# Patient Record
Sex: Female | Born: 1995 | Race: Black or African American | Hispanic: No | Marital: Single | Smoking: Current every day smoker
Health system: Southern US, Community
[De-identification: ages and names within clinical notes are randomized; demographics above are authoritative.]

## PROBLEM LIST (undated history)

## (undated) DIAGNOSIS — E0500 Thyrotoxicosis with diffuse goiter without thyrotoxic crisis or storm: Secondary | ICD-10-CM

---
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
## 2013-09-11 ENCOUNTER — Encounter (HOSPITAL_COMMUNITY): Payer: Self-pay | Admitting: Emergency Medicine

## 2013-09-11 ENCOUNTER — Emergency Department (HOSPITAL_COMMUNITY)
Admission: EM | Admit: 2013-09-11 | Discharge: 2013-09-12 | Disposition: A | Discharge status: Home / Self Care | Payer: 59 | Attending: Emergency Medicine | Admitting: Emergency Medicine

## 2013-09-11 DIAGNOSIS — IMO0001 Reserved for inherently not codable concepts without codable children: Secondary | ICD-10-CM | POA: Diagnosis not present

## 2013-09-11 DIAGNOSIS — R110 Nausea: Secondary | ICD-10-CM | POA: Diagnosis not present

## 2013-09-11 DIAGNOSIS — R6510 Systemic inflammatory response syndrome (SIRS) of non-infectious origin without acute organ dysfunction: Secondary | ICD-10-CM | POA: Diagnosis not present

## 2013-09-11 DIAGNOSIS — A419 Sepsis, unspecified organism: Secondary | ICD-10-CM | POA: Diagnosis not present

## 2013-09-11 DIAGNOSIS — Z862 Personal history of diseases of the blood and blood-forming organs and certain disorders involving the immune mechanism: Secondary | ICD-10-CM | POA: Insufficient documentation

## 2013-09-11 DIAGNOSIS — R197 Diarrhea, unspecified: Secondary | ICD-10-CM | POA: Insufficient documentation

## 2013-09-11 DIAGNOSIS — J029 Acute pharyngitis, unspecified: Secondary | ICD-10-CM

## 2013-09-11 DIAGNOSIS — Z8639 Personal history of other endocrine, nutritional and metabolic disease: Secondary | ICD-10-CM | POA: Insufficient documentation

## 2013-09-11 DIAGNOSIS — Z3202 Encounter for pregnancy test, result negative: Secondary | ICD-10-CM | POA: Diagnosis not present

## 2013-09-11 DIAGNOSIS — J020 Streptococcal pharyngitis: Secondary | ICD-10-CM

## 2013-09-11 LAB — CBC WITH DIFFERENTIAL/PLATELET
Basophils Absolute: 0 10*3/uL (ref 0.0–0.1)
Basophils Relative: 0 % (ref 0–1)
Eosinophils Absolute: 0.1 10*3/uL (ref 0.0–0.7)
Eosinophils Relative: 0 % (ref 0–5)
HCT: 39.5 % (ref 36.0–46.0)
HEMOGLOBIN: 12.7 g/dL (ref 12.0–15.0)
LYMPHS ABS: 1.3 10*3/uL (ref 0.7–4.0)
Lymphocytes Relative: 12 % (ref 12–46)
MCH: 24.4 pg — ABNORMAL LOW (ref 26.0–34.0)
MCHC: 32.2 g/dL (ref 30.0–36.0)
MCV: 76 fL — ABNORMAL LOW (ref 78.0–100.0)
MONOS PCT: 9 % (ref 3–12)
Monocytes Absolute: 1 10*3/uL (ref 0.1–1.0)
NEUTROS ABS: 9 10*3/uL — AB (ref 1.7–7.7)
NEUTROS PCT: 79 % — AB (ref 43–77)
Platelets: 240 10*3/uL (ref 150–400)
RBC: 5.2 MIL/uL — AB (ref 3.87–5.11)
RDW: 15.1 % (ref 11.5–15.5)
WBC: 11.4 10*3/uL — AB (ref 4.0–10.5)

## 2013-09-11 LAB — COMPREHENSIVE METABOLIC PANEL
ALBUMIN: 3.8 g/dL (ref 3.5–5.2)
ALK PHOS: 70 U/L (ref 39–117)
ALT: 13 U/L (ref 0–35)
AST: 15 U/L (ref 0–37)
BILIRUBIN TOTAL: 0.4 mg/dL (ref 0.3–1.2)
BUN: 10 mg/dL (ref 6–23)
CHLORIDE: 100 meq/L (ref 96–112)
CO2: 23 mEq/L (ref 19–32)
Calcium: 9.3 mg/dL (ref 8.4–10.5)
Creatinine, Ser: 0.78 mg/dL (ref 0.50–1.10)
GFR calc non Af Amer: >90 mL/min (ref >90)
GLUCOSE: 93 mg/dL (ref 70–99)
POTASSIUM: 3.9 meq/L (ref 3.7–5.3)
SODIUM: 138 meq/L (ref 137–147)
Total Protein: 8.6 g/dL — ABNORMAL HIGH (ref 6.0–8.3)

## 2013-09-11 LAB — POCT PREGNANCY, URINE: PREG TEST UR: NEGATIVE

## 2013-09-11 NOTE — ED Notes (Signed)
Pt states sore throat, vomiting and diarrhea for two days

## 2013-09-12 LAB — RAPID STREP SCREEN (MED CTR MEBANE ONLY): Streptococcus, Group A Screen (Direct): POSITIVE — AB

## 2013-09-12 LAB — MONONUCLEOSIS SCREEN: Mono Screen: NEGATIVE

## 2013-09-12 MED ORDER — METHYLPREDNISOLONE SODIUM SUCC 125 MG IJ SOLR
125.0000 mg | Freq: Once | INTRAMUSCULAR | Status: AC
  Administered 2013-09-12: 125 mg via INTRAVENOUS
  Filled 2013-09-12: qty 2

## 2013-09-12 MED ORDER — AMOXICILLIN 500 MG PO CAPS: 1000.0000 mg | ORAL_CAPSULE | Freq: Two times a day (BID) | ORAL | Status: DC

## 2013-09-12 MED ORDER — SODIUM CHLORIDE 0.9 % IV BOLUS (SEPSIS)
1000.0000 mL | Freq: Once | INTRAVENOUS | Status: AC
  Administered 2013-09-12: 1000 mL via INTRAVENOUS

## 2013-09-12 MED ORDER — ONDANSETRON HCL 4 MG/2ML IJ SOLN
4.0000 mg | Freq: Once | INTRAMUSCULAR | Status: AC
  Administered 2013-09-12: 4 mg via INTRAVENOUS
  Filled 2013-09-12: qty 2

## 2013-09-12 MED ORDER — IBUPROFEN 600 MG PO TABS: 600.0000 mg | ORAL_TABLET | Freq: Four times a day (QID) | ORAL | Status: DC | PRN

## 2013-09-12 MED ORDER — TRAMADOL HCL 50 MG PO TABS: 50.0000 mg | ORAL_TABLET | Freq: Four times a day (QID) | ORAL | Status: DC | PRN

## 2013-09-12 MED ORDER — KETOROLAC TROMETHAMINE 30 MG/ML IJ SOLN
30.0000 mg | Freq: Once | INTRAMUSCULAR | Status: AC
  Administered 2013-09-12: 30 mg via INTRAVENOUS
  Filled 2013-09-12: qty 1

## 2013-09-12 MED ORDER — ACETAMINOPHEN 500 MG PO TABS
1000.0000 mg | ORAL_TABLET | Freq: Once | ORAL | Status: DC
  Filled 2013-09-12: qty 2

## 2013-09-12 MED ORDER — MORPHINE SULFATE 4 MG/ML IJ SOLN
4.0000 mg | Freq: Once | INTRAMUSCULAR | Status: AC
  Administered 2013-09-12: 4 mg via INTRAVENOUS
  Filled 2013-09-12: qty 1

## 2013-09-12 MED ORDER — DEXTROSE 5 % IV SOLN
1.0000 g | Freq: Once | INTRAVENOUS | Status: AC
  Administered 2013-09-12: 1 g via INTRAVENOUS
  Filled 2013-09-12: qty 10

## 2013-09-12 NOTE — ED Provider Notes (Signed)
CSN: [id number]     Arrival date & time 09/11/13  2114 History   First MD Initiated Contact with Patient 09/12/13 0029     Chief Complaint  Patient presents with  . Nausea  . Sore Throat  . Diarrhea   (Consider location/radiation/quality/duration/timing/severity/associated sxs/prior Treatment) HPI This patient is a 18 year old woman who presents with complaints of sore throat for the past 2 days. Pain is bilateral. Worse with swallowing. It over 10 in severity. Patient is noted have a low-grade fever of 100.7 in the emergency department. She was unaware of fever at home.  She is myalgias, headache, nausea and has had a couple episodes of diarrhea. She denies genitourinary symptoms. Abdominal pain. The patient says that she has experienced both strep pharyngitis and mononucleosis associated pharyngitis in the past.  Past Medical History  Diagnosis Date  . Graves' disease    History reviewed. No pertinent past surgical history. Family History  Problem Relation Age of Onset  . Hypertension Other   . Diabetes Other    History  Substance Use Topics  . Smoking status: Never Smoker   . Smokeless tobacco: Not on file  . Alcohol Use: No   OB History   Grav Para Term Preterm Abortions TAB SAB Ect Mult Living                 Review of Systems Ten point review of symptoms performed and is negative with the exception of symptoms noted above.   Allergies  Review of patient's allergies indicates no known allergies.  Home Medications   Current Outpatient Rx  Name  Route  Sig  Dispense  Refill  . etonogestrel (IMPLANON) 68 MG IMPL implant   Subcutaneous   Inject 1 each into the skin once.          BP 126/73  Pulse 116  Temp(Src) 100.7 F (38.2 C) (Oral)  Resp 18  SpO2 100% Physical Exam Gen: well developed and well nourished appearing Head: NCAT Eyes: PERL, EOMI Nose: no epistaixis or rhinorrhea Mouth/throat: mucosa is moist and pink,  tonsils appear mild to moderately  enlarged, inflamed and with patchy white exudate. No peritonsillar abscess is observed, the uvula is midline. Voice is normal. Neck: supple, no stridor Lungs: CTA B, no wheezing, rhonchi or rales CV: Rapid and regular, pulse 116, no murmur, extremities appear well perfused.  Abd: soft, notender, nondistended Back: no ttp, no cva ttp Skin: warm and dry Ext: normal to inspection, no dependent edema Neuro: CN ii-xii grossly intact, no focal deficits Psyche; normal affect,  calm and cooperative.   ED Course  Procedures (including critical care time) Labs Review  Results for orders placed during the hospital encounter of 09/11/13 (from the past 24 hour(s))  CBC WITH DIFFERENTIAL     Status: Abnormal   Collection Time    09/11/13  9:22 PM      Result Value Range   WBC 11.4 (*) 4.0 - 10.5 K/uL   RBC 5.20 (*) 3.87 - 5.11 MIL/uL   Hemoglobin 12.7  12.0 - 15.0 g/dL   HCT 40.939.5  81.136.0 - 91.446.0 %   MCV 76.0 (*) 78.0 - 100.0 fL   MCH 24.4 (*) 26.0 - 34.0 pg   MCHC 32.2  30.0 - 36.0 g/dL   RDW 78.215.1  95.611.5 - 21.315.5 %   Platelets 240  150 - 400 K/uL   Neutrophils Relative % 79 (*) 43 - 77 %   Neutro Abs 9.0 (*) 1.7 - 7.7  K/uL   Lymphocytes Relative 12  12 - 46 %   Lymphs Abs 1.3  0.7 - 4.0 K/uL   Monocytes Relative 9  3 - 12 %   Monocytes Absolute 1.0  0.1 - 1.0 K/uL   Eosinophils Relative 0  0 - 5 %   Eosinophils Absolute 0.1  0.0 - 0.7 K/uL   Basophils Relative 0  0 - 1 %   Basophils Absolute 0.0  0.0 - 0.1 K/uL  COMPREHENSIVE METABOLIC PANEL     Status: Abnormal   Collection Time    09/11/13  9:22 PM      Result Value Range   Sodium 138  137 - 147 mEq/L   Potassium 3.9  3.7 - 5.3 mEq/L   Chloride 100  96 - 112 mEq/L   CO2 23  19 - 32 mEq/L   Glucose, Bld 93  70 - 99 mg/dL   BUN 10  6 - 23 mg/dL   Creatinine, Ser 4.09  0.50 - 1.10 mg/dL   Calcium 9.3  8.4 - 81.1 mg/dL   Total Protein 8.6 (*) 6.0 - 8.3 g/dL   Albumin 3.8  3.5 - 5.2 g/dL   AST 15  0 - 37 U/L   ALT 13  0 - 35 U/L    Alkaline Phosphatase 70  39 - 117 U/L   Total Bilirubin 0.4  0.3 - 1.2 mg/dL   GFR calc non Af Amer >90  >90 mL/min   GFR calc Af Amer >90  >90 mL/min  POCT PREGNANCY, URINE     Status: None   Collection Time    09/12/13 12:00 AM      Result Value Range   Preg Test, Ur NEGATIVE  NEGATIVE     MDM  Patient with acute pharyngitis and two over four SIRS criteria. We will manage his IV fluids, a single dose of IV Solu-Medrol. Therapy with antibiotics-first dose given intravenously. Strep screens are pending. Anticipate discharge home with plan for close outpatient followup and return precautions the    Brandt Loosen, MD 09/12/13 [phone]

## 2013-09-23 ENCOUNTER — Encounter (HOSPITAL_COMMUNITY): Payer: Self-pay | Admitting: Emergency Medicine

## 2013-09-23 ENCOUNTER — Emergency Department (HOSPITAL_COMMUNITY)
Admission: EM | Admit: 2013-09-23 | Discharge: 2013-09-23 | Disposition: A | Discharge status: Home / Self Care | Payer: BC Managed Care – PPO | Attending: Emergency Medicine | Admitting: Emergency Medicine

## 2013-09-23 DIAGNOSIS — R112 Nausea with vomiting, unspecified: Secondary | ICD-10-CM

## 2013-09-23 DIAGNOSIS — E0500 Thyrotoxicosis with diffuse goiter without thyrotoxic crisis or storm: Secondary | ICD-10-CM | POA: Insufficient documentation

## 2013-09-23 DIAGNOSIS — Z79899 Other long term (current) drug therapy: Secondary | ICD-10-CM | POA: Insufficient documentation

## 2013-09-23 LAB — URINALYSIS, ROUTINE W REFLEX MICROSCOPIC
Glucose, UA: NEGATIVE mg/dL
Hgb urine dipstick: NEGATIVE
Ketones, ur: 15 mg/dL — AB
NITRITE: NEGATIVE
PH: 6 (ref 5.0–8.0)
Protein, ur: 30 mg/dL — AB
SPECIFIC GRAVITY, URINE: 1.029 (ref 1.005–1.030)
UROBILINOGEN UA: 0.2 mg/dL (ref 0.0–1.0)

## 2013-09-23 LAB — CBC WITH DIFFERENTIAL/PLATELET
Basophils Absolute: 0 10*3/uL (ref 0.0–0.1)
Basophils Relative: 0 % (ref 0–1)
EOS ABS: 0.1 10*3/uL (ref 0.0–0.7)
EOS PCT: 1 % (ref 0–5)
HEMATOCRIT: 40.2 % (ref 36.0–46.0)
Hemoglobin: 13.1 g/dL (ref 12.0–15.0)
LYMPHS ABS: 1.5 10*3/uL (ref 0.7–4.0)
LYMPHS PCT: 13 % (ref 12–46)
MCH: 24.6 pg — AB (ref 26.0–34.0)
MCHC: 32.6 g/dL (ref 30.0–36.0)
MCV: 75.6 fL — AB (ref 78.0–100.0)
MONO ABS: 0.6 10*3/uL (ref 0.1–1.0)
Monocytes Relative: 5 % (ref 3–12)
Neutro Abs: 9.3 10*3/uL — ABNORMAL HIGH (ref 1.7–7.7)
Neutrophils Relative %: 81 % — ABNORMAL HIGH (ref 43–77)
PLATELETS: 229 10*3/uL (ref 150–400)
RBC: 5.32 MIL/uL — AB (ref 3.87–5.11)
RDW: 15.4 % (ref 11.5–15.5)
WBC: 11.5 10*3/uL — ABNORMAL HIGH (ref 4.0–10.5)

## 2013-09-23 LAB — COMPREHENSIVE METABOLIC PANEL
ALT: 23 U/L (ref 0–35)
AST: 18 U/L (ref 0–37)
Albumin: 3.8 g/dL (ref 3.5–5.2)
Alkaline Phosphatase: 70 U/L (ref 39–117)
BUN: 12 mg/dL (ref 6–23)
CALCIUM: 9.2 mg/dL (ref 8.4–10.5)
CO2: 22 meq/L (ref 19–32)
CREATININE: 0.73 mg/dL (ref 0.50–1.10)
Chloride: 101 mEq/L (ref 96–112)
GLUCOSE: 119 mg/dL — AB (ref 70–99)
Potassium: 3.9 mEq/L (ref 3.7–5.3)
SODIUM: 140 meq/L (ref 137–147)
TOTAL PROTEIN: 8.2 g/dL (ref 6.0–8.3)
Total Bilirubin: 0.2 mg/dL — ABNORMAL LOW (ref 0.3–1.2)

## 2013-09-23 LAB — URINE MICROSCOPIC-ADD ON

## 2013-09-23 LAB — POCT PREGNANCY, URINE: Preg Test, Ur: NEGATIVE

## 2013-09-23 MED ORDER — ONDANSETRON 4 MG PO TBDP: 8.0000 mg | ORAL_TABLET | ORAL | Status: DC

## 2013-09-23 MED ORDER — ONDANSETRON HCL 4 MG PO TABS: 4.0000 mg | ORAL_TABLET | Freq: Four times a day (QID) | ORAL | Status: DC

## 2013-09-23 MED ORDER — ONDANSETRON 4 MG PO TBDP
8.0000 mg | ORAL_TABLET | Freq: Once | ORAL | Status: AC
  Administered 2013-09-23: 8 mg via ORAL
  Filled 2013-09-23: qty 2

## 2013-09-23 NOTE — ED Provider Notes (Signed)
CSN: [id number]     Arrival date & time 09/23/13  0218 History   First MD Initiated Contact with Patient 09/23/13 0421     Chief Complaint  Patient presents with  . Emesis   (Consider location/radiation/quality/duration/timing/severity/associated sxs/prior Treatment) HPI Pt presenting with c/o nausea and vomiting.  Pt states symptoms began this evening after waking up from a nap.  She states she has had approx 7 episdoes of emesis.  Nonbloody and nonbilious.  No abdominal pain.  No dysuria.  Pt states she has felt cold chills.  Denies increased urinary frequency, or urgency.  No known sick contacts.  No cough or dificulty breathing.  Triage note states lower abdominal cramping, but patient denied having any abdominal pain to me when asked.  She has not been able to keep down liquids since vomiting began.  There are no other associated systemic symptoms, there are no other alleviating or modifying factors.   Past Medical History  Diagnosis Date  . Graves' disease    History reviewed. No pertinent past surgical history. Family History  Problem Relation Age of Onset  . Hypertension Other   . Diabetes Other    History  Substance Use Topics  . Smoking status: Never Smoker   . Smokeless tobacco: Not on file  . Alcohol Use: No   OB History   Grav Para Term Preterm Abortions TAB SAB Ect Mult Living                 Review of Systems ROS reviewed and all otherwise negative except for mentioned in HPI  Allergies  Review of patient's allergies indicates no known allergies.  Home Medications   Current Outpatient Rx  Name  Route  Sig  Dispense  Refill  . etonogestrel (IMPLANON) 68 MG IMPL implant   Subcutaneous   Inject 1 each into the skin once.         Marland Kitchen. amoxicillin (AMOXIL) 500 MG capsule   Oral   Take 2 capsules (1,000 mg total) by mouth 2 (two) times daily.   20 capsule   0   . ibuprofen (ADVIL,MOTRIN) 600 MG tablet   Oral   Take 1 tablet (600 mg total) by mouth every 6  (six) hours as needed.   30 tablet   0   . ondansetron (ZOFRAN) 4 MG tablet   Oral   Take 1 tablet (4 mg total) by mouth every 6 (six) hours.   12 tablet   0   . traMADol (ULTRAM) 50 MG tablet   Oral   Take 1 tablet (50 mg total) by mouth every 6 (six) hours as needed.   15 tablet   0    BP 142/63  Pulse 104  Temp(Src) 98.6 F (37 C) (Oral)  Resp 16  Ht 5\' 4"  (1.626 m)  Wt 235 lb (106.595 kg)  BMI 40.32 kg/m2  SpO2 100% Vitals reviewed Physical Exam Physical Examination: General appearance - alert, well appearing, and in no distress Mental status - alert, oriented to person, place, and time Eyes - no scleral icterus, no conjunctival injection Mouth - mucous membranes moist, pharynx normal without lesions Chest - clear to auscultation, no wheezes, rales or rhonchi, symmetric air entry Heart - normal rate, regular rhythm, normal S1, S2, no murmurs, rubs, clicks or gallops Abdomen - soft, nontender, nondistended, no masses or organomegaly, nabs Extremities - peripheral pulses normal, no pedal edema, no clubbing or cyanosis Skin - normal coloration and turgor, no rashes  ED Course  Procedures (including critical care time) Labs Review Labs Reviewed  CBC WITH DIFFERENTIAL - Abnormal; Notable for the following:    WBC 11.5 (*)    RBC 5.32 (*)    MCV 75.6 (*)    MCH 24.6 (*)    Neutrophils Relative % 81 (*)    Neutro Abs 9.3 (*)    All other components within normal limits  COMPREHENSIVE METABOLIC PANEL - Abnormal; Notable for the following:    Glucose, Bld 119 (*)    Total Bilirubin 0.2 (*)    All other components within normal limits  URINALYSIS, ROUTINE W REFLEX MICROSCOPIC - Abnormal; Notable for the following:    APPearance CLOUDY (*)    Bilirubin Urine SMALL (*)    Ketones, ur 15 (*)    Protein, ur 30 (*)    Leukocytes, UA SMALL (*)    All other components within normal limits  URINE MICROSCOPIC-ADD ON - Abnormal; Notable for the following:    Squamous  Epithelial / LPF MANY (*)    Bacteria, UA MANY (*)    All other components within normal limits  URINE CULTURE  POCT PREGNANCY, URINE   Imaging Review No results found.  EKG Interpretation   None       MDM   1. Nausea and vomiting    Pt presenting with c/o nausea and vomiting which began earlier tonight.   Patient is overall nontoxic and well hydrated in appearance. Abdominal exam is benign.  Pt feels improved after zofran and is able to tolerate po fluids.  Suspect viral syndrome due to abrupt onset of sympotms, labs show mild leukocytosis but otherwise are reassuring.  urinlaysis appears contaminated and patietn denies having any urinary symptoms, urine culture is pending.  Discharged with strict return precautions.  Pt agreeable with plan.     Ethelda Chick, MD 09/23/13 [phone]

## 2013-09-23 NOTE — Discharge Instructions (Signed)
Return to the ED with any concerns including vomiting and not able to keep down liquids, abdominal pain- especially if it localizes to the right lower abdomen, fainting, decreased level of alertness/lethargy, or any other alarming symptoms  Emergency Department Resource Guide 1) Find a Doctor and Pay Out of Pocket Although you won't have to find out who is covered by your insurance plan, it is a good idea to ask around and get recommendations. You will then need to call the office and see if the doctor you have chosen will accept you as a new patient and what types of options they offer for patients who are self-pay. Some doctors offer discounts or will set up payment plans for their patients who do not have insurance, but you will need to ask so you aren't surprised when you get to your appointment.  2) Contact Your Local Health Department Not all health departments have doctors that can see patients for sick visits, but many do, so it is worth a call to see if yours does. If you don't know where your local health department is, you can check in your phone book. The CDC also has a tool to help you locate your state's health department, and many state websites also have listings of all of their local health departments.  3) Find a Walk-in Clinic If your illness is not likely to be very severe or complicated, you may want to try a walk in clinic. These are popping up all over the country in pharmacies, drugstores, and shopping centers. They're usually staffed by nurse practitioners or physician assistants that have been trained to treat common illnesses and complaints. They're usually fairly quick and inexpensive. However, if you have serious medical issues or chronic medical problems, these are probably not your best option.  No Primary Care Doctor: - Call Health Connect at  [phone] - they can help you locate a primary care doctor that  accepts your insurance, provides certain services,  etc. - Physician Referral Service- [phone]  Chronic Pain Problems: Organization         Address  Phone   Notes  [facility]  [phone] Patients need to be referred by their primary care doctor.   Medication Assistance: Organization         Address  Phone   Notes  [facility] Medication [facility] 2[facility] Program [address] [place], [place] [address] [phone] --Must be a resident of [facility] [place] -- Must have NO insurance coverage whatsoever (no Medicaid/ Medicare, etc.) -- The pt. MUST have a primary care doctor that directs their care regularly and follows them in the community   Med[facility]ist  [phone]   [employer]  [phone]    Agencies that provide inexpensive medical care: Organization         Address  Phone   Notes  [facility]  [phone]   [facility]    [phone]   [facility] [address] [place], [place] [address] [phone]) [phone]   [facility] [address], [place] [phone]   [facility]    [phone](336) [phone]   [facility]    [phone]   [facility]  [address], [place] Phone:  [phone], Fax:  [phone] Hours of Operation:  9 am - 6 pm, M-F.  Also accepts Medicaid/Medicare and self-pay.    [facility] ° [address], [place] Phone: [phone], Fax: [phone]. Hours of Operation:  8:30 am - 5:30 pm, M-F.  Also accepts Medicaid and self-pay.  °[facility] [place] [address], [place] Phone: [phone]   °[facility] [address], [place], [place] [phone], Ext. 123 Mondays & Thursdays: 7-9 AM.  First 15 patients are seen on a first come, first serve basis. °  ° °Medicaid-accepting [place] Providers: ° °Organization         Address  Phone   Notes  °[facility] [address], [place] [phone] Also accepts self-pay patients.  °[facility] [address], [place] ° [phone]) [phone]   °[facility] [address], [place] [phone]) [phone]   °[facility] [address] [place] [address], [place] [phone]) [phone]   °Veita Bland [address], [place]  ° [phone] Only accepts [place] Access Medicaid patients after they have their name applied to their card.  ° °Self-Pay (no insurance) in [place]: ° °Organization         Address  Phone   Notes  °Sickle Cell Patients, [facility] [address], [place] [phone]) [phone]   °[facility] [address], [place] [phone]) [phone]   °[facility] ° [address], [place] [phone]) [phone]   °[facility]/Dr. Osei-Bonsu ° [address] [place] [address], [place] or [address], [place] [phone] Phone number for both [place] and [place] locations is the same.  °[facility] [address], [place] [phone]   °[facility] [address] [place] [address], [place] or [address] [phone]) [phone] °([phone]) [phone]   °[facility] [address], [place] [phone]) [phone], phone; [phone], fax Sees patients 1st and 3rd Saturday of every month.  Must not qualify for public or private insurance (i.e. Medicaid, Medicare, [place] Health Choice, Veterans' Benefits) • Household income should be no more than 200% of the poverty level •The clinic cannot treat you if you are pregnant or think you are pregnant • Sexually transmitted diseases are not treated at the clinic.  ° ° °Dental Care: °Organization         Address  Phone  Notes  °[place] [facility] [address], [place] [phone]) [phone] Accepts children up to  age 21 who are enrolled in Medicaid or [place] Health Choice; pregnant women with a Medicaid card; and children who have applied for Medicaid or [place] Health Choice, but were declined, whose parents can pay a reduced fee at time of service.  °[place] [facility] [place]  [address], [place] [phone]) [phone] Accepts children up to age 21 who are enrolled in Medicaid or  Health Choice; pregnant women with a Medicaid card; and children who have applied for Medicaid or  Health Choice, but were declined, whose parents can pay a reduced fee at time of service.  °[facility] ° [address], [place] [phone]) [phone] Patients are seen by appointment only. Walk-ins are not accepted. [facility] will see patients 18 years of age and older. °Monday - Tuesday (8am-5pm) °Most Wednesdays (8:30-5pm) °$30 per visit,   only  [facility]  [address], [place] [phone]) [phone] Patients are seen by appointment only. Walk-ins are not accepted. [facility] will see patients 20 years of age and older. One Wednesday Evening (Monthly: Volunteer Based).  $30 per visit, cash only  [facility]  [phone] for adults; Children under age 494, call Graduate Pediatric Dentistry at [phone]. Children aged 524-14, please call [phone] to request a pediatric application.  Dental services are provided in all areas of dental care including fillings, crowns and bridges, complete and partial dentures, implants, gum treatment, root canals, and extractions. Preventive care is also provided. Treatment is provided to both adults and children. Patients are selected via a lottery and there is often a waiting list.   [facility] [address], [place]  [phone] [email or web address]   [facility] [address], [place], [place] [phone], Ext. [phone] Second and Fourth Thursday of  each month, opens at 6:30 AM; Clinic ends at 9 AM.  Patients are seen on a first-come first-served basis, and a limited number are seen during each clinic.   [facility]  [address], [place], [place] [phone](336) [phone]   Eligibility Requirements You must have lived in [place], [place], or [place] for at least the last three months.   You cannot be eligible for state or federal sponsored [place] Cityhealthcare insurance, including CIGNAVeterans Administration, [place]icaid, or [employer]Medicare.   You generally cannot be eligible for healthcare insurance through your employer.    How to apply: Eligibility screenings are held every Tuesday and Wednesday afternoon from 1:00 pm until 4:00 pm. You do not need an appointment for the interview!  [facility] [address], [place], [place] [phone]   [facility]  [phone]   [facility]  [phone]   [facility]  [phone]    Behavioral Health Resources in the Community: Intensive Outpatient Programs Organization         Address  Phone  Notes  [facility] [address], [place], [place] [phone]   [facility] [address], [place], [place] [phone]   [facility] [address], [place], [place]  [phone]   [facility] [address],  [place], [place] [phone] or [phone]   Substance Abuse Resources Organization         Address  Phone  Notes  [facility]  [phone]   [facility]  [phone]   [facility]  [phone]   [facility]  [phone]   Residential & Outpatient Substance Abuse Program  [phone]   Psychological Services Organization         Address  Phone  Notes  [facility]  [phone]- [phone]   [facility]  [phone]- [phone]   [facility] [address],  [place] [phone] or [phone]    Mobile Crisis Teams Organization         Address  Phone  Notes  [facility], Mobile Crisis Care Unit  [phone]   [facility]  [address] [place], [place] [phone]   Doristine LocksSharon DeEsch [address] [place] [phone]    Self-Help/Support Groups Organization         Address  Phone  Notes  [facility] [place] - variety of support groups  Gail Call for more information  Narcotics Anonymous (NA), [facility] [address], [employer] [place]  2 meetings at this location   Special educational needs teacher         Address  Phone  Notes  [facility] [place],    [place]  [phone]   [facility]  [address] [place] [address]60, [place], [place]   [place], [place] [phone] Admissions: 8am-3pm M-F  Incentives Substance [place] [address],    [place], [place] [phone]   [facility] [address] [place], [place], [place]   [facility] [address],  [place], [place]   [facility] - Intensive Outpatient [address], [place], [place]   [facility] [place] [address].,  [place], [place] [phone] or [phone]   [facility] ([facility]) [address], [place], [place] Accepts Medicaid  [facility] [address],  [place] [phone] Substance Abuse/Addiction Treatment   [facility] Organization         Address  Phone  Notes  [facility]  [phone]   Domenic Schwab, PhD [address]lis [place], [place]   [phone] or [phone]   [place] [address] [place] [place], [place] [phone]     [facility] [address], [place], [place] [phone] Insurance/Medicaid/sponsorship through [facility] [address], [address] [place]                                    [place], [place] [id number] [place] [address], [place] [phone]    Dr. Adele Schilder  [phone]   [facility] [facility]. 1) [address] [place] 2) [place] 3)  [place] [address], [place] [phone]  [phone]   [place] [phone] or [phone] (After Hours)

## 2013-09-23 NOTE — ED Notes (Signed)
Pt. reports nausea and vomitting onset this evening with low abdominal cramping and chills.

## 2013-09-24 LAB — URINE CULTURE: Colony Count: >=100000

## 2014-06-02 ENCOUNTER — Observation Stay: Payer: Self-pay | Admitting: Internal Medicine

## 2014-06-02 LAB — COMPREHENSIVE METABOLIC PANEL
ANION GAP: 15 (ref 7–16)
Albumin: 4.2 g/dL (ref 3.4–5.0)
Alkaline Phosphatase: 79 U/L
BUN: 9 mg/dL (ref 7–18)
Bilirubin,Total: 0.4 mg/dL (ref 0.2–1.0)
CALCIUM: 9.6 mg/dL (ref 8.5–10.1)
CREATININE: 0.91 mg/dL (ref 0.60–1.30)
Chloride: 106 mmol/L (ref 98–107)
Co2: 21 mmol/L (ref 21–32)
EGFR (African American): >60
EGFR (Non-African Amer.): >60
Glucose: 161 mg/dL — ABNORMAL HIGH (ref 65–99)
Osmolality: 285 (ref 275–301)
POTASSIUM: 3.5 mmol/L (ref 3.5–5.1)
SGOT(AST): 29 U/L (ref 15–37)
SGPT (ALT): 28 U/L
Sodium: 142 mmol/L (ref 136–145)
Total Protein: 8.9 g/dL — ABNORMAL HIGH (ref 6.4–8.2)

## 2014-06-02 LAB — CBC
HCT: 43.4 % (ref 35.0–47.0)
HGB: 13.6 g/dL (ref 12.0–16.0)
MCH: 24 pg — AB (ref 26.0–34.0)
MCHC: 31.2 g/dL — ABNORMAL LOW (ref 32.0–36.0)
MCV: 77 fL — ABNORMAL LOW (ref 80–100)
Platelet: 274 10*3/uL (ref 150–440)
RBC: 5.64 10*6/uL — AB (ref 3.80–5.20)
RDW: 14.3 % (ref 11.5–14.5)
WBC: 7.3 10*3/uL (ref 3.6–11.0)

## 2014-06-02 LAB — TSH: Thyroid Stimulating Horm: 1.13 u[IU]/mL

## 2014-06-02 LAB — URINALYSIS, COMPLETE
BLOOD: NEGATIVE
Bacteria: NONE SEEN
Bilirubin,UR: NEGATIVE
Glucose,UR: NEGATIVE mg/dL (ref 0–75)
LEUKOCYTE ESTERASE: NEGATIVE
Nitrite: NEGATIVE
PH: 6 (ref 4.5–8.0)
Protein: NEGATIVE
Specific Gravity: >1.06 (ref 1.003–1.030)
Squamous Epithelial: 5
WBC UR: <1 /HPF (ref 0–5)

## 2014-06-02 LAB — HCG, QUANTITATIVE, PREGNANCY: Beta Hcg, Quant.: <1 m[IU]/mL — ABNORMAL LOW

## 2014-06-02 LAB — LIPASE, BLOOD: Lipase: 143 U/L (ref 73–393)

## 2014-06-03 LAB — CBC WITH DIFFERENTIAL/PLATELET
Basophil #: 0 10*3/uL (ref 0.0–0.1)
Basophil %: 0.6 %
Eosinophil #: 0 10*3/uL (ref 0.0–0.7)
Eosinophil %: 0.5 %
HCT: 41.1 % (ref 35.0–47.0)
HGB: 12.9 g/dL (ref 12.0–16.0)
Lymphocyte #: 1.5 10*3/uL (ref 1.0–3.6)
Lymphocyte %: 25.4 %
MCH: 24.3 pg — ABNORMAL LOW (ref 26.0–34.0)
MCHC: 31.4 g/dL — ABNORMAL LOW (ref 32.0–36.0)
MCV: 77 fL — ABNORMAL LOW (ref 80–100)
Monocyte #: 0.6 x10 3/mm (ref 0.2–0.9)
Monocyte %: 10.1 %
Neutrophil #: 3.7 10*3/uL (ref 1.4–6.5)
Neutrophil %: 63.4 %
Platelet: 246 10*3/uL (ref 150–440)
RBC: 5.31 10*6/uL — ABNORMAL HIGH (ref 3.80–5.20)
RDW: 14.8 % — ABNORMAL HIGH (ref 11.5–14.5)
WBC: 5.9 10*3/uL (ref 3.6–11.0)

## 2014-06-03 LAB — BASIC METABOLIC PANEL
Anion Gap: 12 (ref 7–16)
BUN: 8 mg/dL (ref 7–18)
Calcium, Total: 9.1 mg/dL (ref 8.5–10.1)
Chloride: 106 mmol/L (ref 98–107)
Co2: 22 mmol/L (ref 21–32)
Creatinine: 0.86 mg/dL (ref 0.60–1.30)
EGFR (African American): >60
EGFR (Non-African Amer.): >60
Glucose: 100 mg/dL — ABNORMAL HIGH (ref 65–99)
Osmolality: 278 (ref 275–301)
Potassium: 3.2 mmol/L — ABNORMAL LOW (ref 3.5–5.1)
Sodium: 140 mmol/L (ref 136–145)

## 2014-06-03 LAB — MAGNESIUM: Magnesium: 1.7 mg/dL — ABNORMAL LOW

## 2014-06-03 LAB — HEMOGLOBIN A1C: Hemoglobin A1C: 5.5 % (ref 4.2–6.3)

## 2014-06-03 LAB — T4, FREE: Free Thyroxine: 1.42 ng/dL (ref 0.76–1.46)

## 2014-06-04 ENCOUNTER — Ambulatory Visit: Payer: Self-pay | Admitting: Gastroenterology

## 2014-06-04 LAB — DRUG SCREEN, URINE
Amphetamines, Ur Screen: NEGATIVE (ref ?–1000)
BARBITURATES, UR SCREEN: NEGATIVE (ref ?–200)
Benzodiazepine, Ur Scrn: NEGATIVE (ref ?–200)
COCAINE METABOLITE, UR ~~LOC~~: NEGATIVE (ref ?–300)
Cannabinoid 50 Ng, Ur ~~LOC~~: POSITIVE (ref ?–50)
MDMA (Ecstasy)Ur Screen: NEGATIVE (ref ?–500)
Methadone, Ur Screen: NEGATIVE (ref ?–300)
Opiate, Ur Screen: NEGATIVE (ref ?–300)
Phencyclidine (PCP) Ur S: NEGATIVE (ref ?–25)
Tricyclic, Ur Screen: NEGATIVE (ref ?–1000)

## 2014-06-04 LAB — CBC WITH DIFFERENTIAL/PLATELET
Basophil #: 0 10*3/uL (ref 0.0–0.1)
Basophil %: 0.3 %
EOS ABS: 0 10*3/uL (ref 0.0–0.7)
Eosinophil %: 0.5 %
HCT: 38.3 % (ref 35.0–47.0)
HGB: 11.7 g/dL — AB (ref 12.0–16.0)
LYMPHS ABS: 1.4 10*3/uL (ref 1.0–3.6)
Lymphocyte %: 26.3 %
MCH: 23.8 pg — AB (ref 26.0–34.0)
MCHC: 30.5 g/dL — ABNORMAL LOW (ref 32.0–36.0)
MCV: 78 fL — AB (ref 80–100)
Monocyte #: 0.7 x10 3/mm (ref 0.2–0.9)
Monocyte %: 13.7 %
Neutrophil #: 3.1 10*3/uL (ref 1.4–6.5)
Neutrophil %: 59.2 %
PLATELETS: 213 10*3/uL (ref 150–440)
RBC: 4.91 10*6/uL (ref 3.80–5.20)
RDW: 14.8 % — AB (ref 11.5–14.5)
WBC: 5.3 10*3/uL (ref 3.6–11.0)

## 2014-06-04 LAB — BASIC METABOLIC PANEL
Anion Gap: 10 (ref 7–16)
BUN: 6 mg/dL — ABNORMAL LOW (ref 7–18)
CHLORIDE: 105 mmol/L (ref 98–107)
CO2: 22 mmol/L (ref 21–32)
Calcium, Total: 8.3 mg/dL — ABNORMAL LOW (ref 8.5–10.1)
Creatinine: 0.75 mg/dL (ref 0.60–1.30)
Glucose: 86 mg/dL (ref 65–99)
Osmolality: 271 (ref 275–301)
Potassium: 3.4 mmol/L — ABNORMAL LOW (ref 3.5–5.1)
SODIUM: 137 mmol/L (ref 136–145)

## 2014-06-05 LAB — CBC WITH DIFFERENTIAL/PLATELET
BASOS ABS: 0 10*3/uL (ref 0.0–0.1)
BASOS PCT: 0.4 %
EOS ABS: 0 10*3/uL (ref 0.0–0.7)
Eosinophil %: 0.4 %
HCT: 44.3 % (ref 35.0–47.0)
HGB: 14.1 g/dL (ref 12.0–16.0)
Lymphocyte #: 1.2 10*3/uL (ref 1.0–3.6)
Lymphocyte %: 19.1 %
MCH: 24.6 pg — AB (ref 26.0–34.0)
MCHC: 31.8 g/dL — ABNORMAL LOW (ref 32.0–36.0)
MCV: 77 fL — ABNORMAL LOW (ref 80–100)
MONOS PCT: 12 %
Monocyte #: 0.8 x10 3/mm (ref 0.2–0.9)
NEUTROS PCT: 68.1 %
Neutrophil #: 4.3 10*3/uL (ref 1.4–6.5)
PLATELETS: 235 10*3/uL (ref 150–440)
RBC: 5.73 10*6/uL — AB (ref 3.80–5.20)
RDW: 14.6 % — ABNORMAL HIGH (ref 11.5–14.5)
WBC: 6.3 10*3/uL (ref 3.6–11.0)

## 2014-06-05 LAB — IRON AND TIBC
IRON BIND. CAP.(TOTAL): 372 ug/dL (ref 250–450)
Iron Saturation: 16 %
Iron: 59 ug/dL (ref 50–170)
UNBOUND IRON-BIND. CAP.: 313 ug/dL

## 2014-06-05 LAB — BASIC METABOLIC PANEL
Anion Gap: 9 (ref 7–16)
BUN: 6 mg/dL — ABNORMAL LOW (ref 7–18)
CALCIUM: 9.2 mg/dL (ref 8.5–10.1)
Chloride: 102 mmol/L (ref 98–107)
Co2: 24 mmol/L (ref 21–32)
Creatinine: 0.81 mg/dL (ref 0.60–1.30)
EGFR (Non-African Amer.): >60
Glucose: 95 mg/dL (ref 65–99)
Osmolality: 268 (ref 275–301)
Potassium: 3.4 mmol/L — ABNORMAL LOW (ref 3.5–5.1)
Sodium: 135 mmol/L — ABNORMAL LOW (ref 136–145)

## 2014-06-05 LAB — FERRITIN: Ferritin (ARMC): 82 ng/mL (ref 8–388)

## 2014-06-06 LAB — PATHOLOGY REPORT

## 2014-12-12 NOTE — Consult Note (Signed)
Pt seen and examined. Full consult to follow. Pt with severe nausea and vomiting for at least a week or so, associated with upper abdominal pain. Both CT and U/S are negative. Occasional heartburn. Hx of Graves' disease. Mother present. Mother concerned about pt's boy friend poisoning her. Also, mother's medical hx is interesting as well. She was recently diagnosed with autoimmune lupus and achalasia. Pt denies dysphagia. Mother also developed acute liver failure for which she was placed on liver transplant before liver condition resolved on own. Continue nausea meds. Start dailyContinue PPI. Plan EGD tomorrow AM to check for GERD, PUD, etc. If completely negative, consider lupus w/u and toxicology w/u for patient later. Will follow. Thanks.   Electronic Signatures: Lutricia Feilh, Terin Cragle (MD) (Signed on 14-Oct-15 14:28)  Authored   Last Updated: 14-Oct-15 14:31 by Lutricia Feilh, Jovan Schickling (MD)

## 2014-12-12 NOTE — H&P (Signed)
PATIENT NAME:  Amanda Boone, [name] MR#:  [id number] DATE OF BIRTH:  [birth year]  DATE OF ADMISSION:  06/02/2014  PRIMARY CARE PHYSICIAN: None.  REFERRING EMERGENCY ROOM PHYSICIAN: Lucrezia EuropeAllison Webster, MD   CHIEF COMPLAINT: Nausea and vomiting.   HISTORY OF PRESENT ILLNESS: This very pleasant 19 year old female with past medical history of Graves disease presents today with nausea and vomiting for the past 12 hours. She reports that yesterday morning she woke up feeling slightly nauseated, but symptoms resolved and she felt well for the rest of the day. She was able to eat normally yesterday. At about 7:00 p.m. yesterday, day prior to admission, she developed acute onset nausea with repetitive vomiting. She denies diarrhea. Denies fevers or chills. She does report diffuse abdominal pain, worse in the upper abdominal quadrants, mid epigastric area. No hematemesis. No known sick contacts. She is not able to identify any food that could have triggered these symptoms. She actually lives in [place], [place] and is in town visiting her mother and also to attend a court date today for a speeding ticket. So far in the Emergency Room she has received 2 liters of normal saline and Phenergan as well as Zofran with no relief of symptoms.   PAST MEDICAL HISTORY: Graves disease status post radioactive iodine ablation.   PAST SURGICAL HISTORY: None.   ALLERGIES: No known allergies.   HOME MEDICATIONS: No home medications.   SOCIAL HISTORY: [name] currently lives in [place], [place] with her boyfriend. She denies smoking cigarettes or using illicit substances. She reports very rare alcohol consumption, less than 1 drink per week. She is not currently working. She does not have children and is not exposed to children. She is a former [employer] student.   FAMILY HISTORY: Positive for stroke in her father at age 21. Negative for coronary artery disease or cancer. There is no family history of Crohn disease, ulcerative  colitis or other chronic gastrointestinal disease.   REVIEW OF SYSTEMS: CONSTITUTIONAL: Negative for fevers, chills, fatigue, weakness, change in weight.  HEENT: No change in vision or hearing. No pain in the eyes or ears. No sore throat or difficulty swallowing.  RESPIRATORY: No shortness of breath, wheezing, orthopnea, cough.  CARDIOVASCULAR: No palpitations, chest pain, syncope, edema.  GASTROINTESTINAL: Positive as mentioned above for nausea, vomiting, abdominal pain. No hematemesis, diarrhea, hematochezia, constipation.  MUSCULOSKELETAL: No myalgias, swollen or tender muscles, decreased range of motion, decrease in strength.  NEUROLOGIC: No headache, vision change, seizure, confusion, focal numbness or weakness.  PSYCHIATRIC: No history of uncontrolled depression or anxiety.   PHYSICAL EXAMINATION: VITAL SIGNS: Temperature 97.9, pulse 70, respirations 20, blood pressure 146/111 and oxygenation 99% on room air.  GENERAL: Fatigued, no acute distress.  HEENT: Pupils are equal, round and reactive to light. Extraocular motion is intact. Conjunctivae are clear with no icterus. Oral mucous membranes are dry. Posterior oropharynx is clear with no exudate or lesion. Good dentition. No anterior cervical lymphadenopathy. Trachea is midline. Thyroid is nontender. No thyroid nodule noted.  RESPIRATORY: Lungs are clear to auscultation bilaterally with good air movement.  CARDIOVASCULAR: Regular rate and rhythm. No murmurs, rubs, or gallops. No peripheral edema. Peripheral pulses are 2+.  ABDOMEN: Soft, not distended, is diffusely tender with tenderness mostly in the mid epigastric area. No guarding, no mass, no rebound. Bowel sounds are normal.  MUSCULOSKELETAL: Range of motion is normal in all joints, strength is 4 out of 4 throughout.  NEUROLOGIC: Cranial nerves II through XII are grossly intact. Sensation and strength  are intact. Nonfocal neurologic examination.  PSYCHIATRIC: The patient is alert and  oriented x4 with good insight into her condition. No signs of uncontrolled depression or anxiety.   DIAGNOSTIC DATA: Labs: Sodium 142, potassium 3.5, chloride 106, bicarb 21, BUN 9, creatinine 0.91, glucose 161. Lipase 143. Beta HCG less than 1. LFTs are normal. White blood cell count 7.3, hemoglobin 13.6, platelets 274,000, MCV 77.   Imaging: Ultrasound of the abdomen: Normal right upper quadrant abdominal sonogram. No gallstones or wall thickening.   CT abdomen and pelvis with contrast: No acute abnormalities. Appendix is not visualized.   ASSESSMENT AND PLAN: Intractable vomiting: Likely due to viral gastroenteritis. We will continue with Zofran and IV hydration. Electrolytes are normal. The patient currently resting. She can have a clear liquid diet and advance as tolerated. We will continue to monitor. Expect her symptoms to resolve over the next 24 hours. She is being admitted to the medical surgical floor under observation. We will check a TSH to be sure that this is not related to her history of Graves disease.   TIME SPENT ON ADMISSION: 40 minutes.   ____________________________ Ena Dawley. Clent Ridges, MD cpw:sb D: 06/02/2014 08:30:04 ET T: 06/02/2014 08:49:13 ET JOB#: [id number]  cc: Santina Evans P. Clent Ridges, MD, <Dictator> Gale Journey MD ELECTRONICALLY SIGNED 06/02/2014 12:48

## 2014-12-12 NOTE — Discharge Summary (Signed)
PATIENT NAME:  Amanda Boone, Amanda Boone MR#:  [id number] DATE OF BIRTH:  Oct 24, 1995  DATE OF ADMISSION:  06/02/2014 DATE OF DISCHARGE:  06/05/2014  DISCHARGE DIAGNOSES: 1.  Nausea and vomiting.  2.  Mild gastritis.  3.  Hypokalemia.   CONSULTATIONS:  Lutricia FeilPaul Oh, MD, gastroenterology.   PROCEDURES: 1.  Upper endoscopy performed by Dr. Bluford Kaufmannh showed mild gastritis in the antrum only. Biopsies were taken and are pending.  2.  CT abdomen and pelvis with contrast on 06/02/2014 shows no acute abnormalities; however, the appendix was not well visualized.  3.  Ultrasound of the abdomen 06/02/2014 shows normal right upper quadrant abdominal sonogram.   HISTORY OF PRESENT ILLNESS: This very pleasant 19 year old female with past medical history of Graves disease presents with greater than 1 week of nausea and vomiting which has been episodic except for the last 12 hours when the vomiting has become constant and repetitive. She denies diarrhea, fevers or chills. She has no known sick contacts. No hematemesis.   HOSPITAL COURSE:  1.  Nausea and vomiting: Symptoms were fairly well treated with Phenergan and then Zofran. When she continued to be nauseated and not tolerating meals we consulted gastroenterology who performed an EGD. EGD showed mild gastritis at the antrum and Protonix was started. She will remain on Protonix for 1 month due to gastritis. At the time of discharge we have advanced her diet and she tolerated it well.  Due to her past history of Graves disease.  Thyroid function was checked and was normal with a TSH of 1.1. A UDS was checked, which was positive only for cannabinoids. UA was negative for signs of infection. Stomach biopsies show mild, possibly chronic, gastritis. No metaplasia and or dysplasia. No Helicobacter pylori.  Symptoms should improve with PPI therapy. She has agreed to find a primary care physician so that if she has recurrence of symptoms she can be treated by a local provider.  2.   Hypokalemia. The patient was hypokalemic on presentation due to vomiting. She was repleted in the hospital. At the time of discharge she has very mild hypokalemia, which has been repleted today. She is advised to maintain a healthy diet, which includes fruits and vegetables to obtain adequate potassium.  3.  History of Graves disease. TSH was checked and was 1.13, normal. T4 was also normal. Symptoms are not thought to be a recurrence or related to previous Graves disease.   DISCHARGE PHYSICAL EXAMINATION: VITAL SIGNS: Temperature 98.5, pulse 70, respirations 17, blood pressure 137/83. Oxygenation 99% on room air.  GENERAL: No acute distress.  CARDIOVASCULAR: Regular rate and rhythm. No murmurs, rubs or gallops.  PULMONARY: Lungs are clear to auscultation bilaterally with good air movement.  ABDOMEN: Soft, nontender, nondistended. No guarding, no rebound, no mass.  EXTREMITIES: No edema, 2+ peripheral pulses.   LABORATORY DATA: Sodium 135, potassium 3.4, chloride 102, bicarbonate 24. BUN 6, creatinine 0.08. Glucose 95. Hemoglobin A1c was 5.5. Ferritin 82. Urine pregnancy test was negative. LFTs were normal. TSH 1.13, T4 1.42. UDS positive for cannabinoids otherwise negative. White blood cells 6.3, hemoglobin 14.1, platelets 235,000. MCV 77. UA negative for signs of infection.  PATHOLOGY:  From gastric biopsy shows no Helicobacter, no malignancy. Mild possibly chronic gastritis.   DISCHARGE MEDICATIONS: 1.  Pantoprazole 40 mg 1 tablet once a day.  2.  Nexplanon 68 mg subcutaneous implant.    CONDITION ON DISCHARGE: Stable.   DISCHARGE INSTRUCTIONS:  DIET: Maintain a healthy broad diet which is rich with fruits  and vegetables.   ACTIVITY: No restrictions.   FOLLOWUP:  Find a primary care provider in the next 2 to 4 weeks.   Time spent on discharge: 35 minutes   ____________________________ Ena Dawley. Clent Ridges, MD cpw:jw D: 06/07/2014 20:39:00 ET T: 06/07/2014 21:24:50  ET JOB#: [id number]  cc: Ena Dawley. Clent Ridges, MD, <Dictator> Gale Journey MD ELECTRONICALLY SIGNED 06/15/2014 15:41

## 2014-12-12 NOTE — Consult Note (Signed)
PATIENT NAME:  Amanda Boone, Amanda Boone MR#:  [id number] DATE OF BIRTH:  16-Jul-1996  DATE OF CONSULTATION:  06/03/2014  CONSULTING PHYSICIAN:  Ezzard StandingPaul Y. Evangelyne Loja, MD  REASON FOR REFERRAL: Nausea and vomiting.   DESCRIPTION: The patient is a 19 year old black female with a known history of Graves' disease requiring radiation and now on thyroid supplementation. She came to the Emergency Room last night with bouts of nausea and vomiting for the past week or so. When she vomits she feels quite nauseated and has associated abdominal pain that could be significant as well. The symptoms started about 1 week ago, then it got better, then it got worse again right before she got admitted. She lives in [place] with her boyfriend. She was visiting her mother when she got sick and required hospitalization. Even today she still feels nauseated and has abdominal pain. Fortunately for her, ultrasound of the abdomen as well as CT of the abdomen was completely normal. She is getting Phenergan and Zofran without significant relief of symptoms.   PAST MEDICAL HISTORY: Notable for Graves' disease.   ALLERGIES: She has no known drug allergies.   MEDICATIONS: She is on no medications at home. She denies any surgical history.   SOCIAL HISTORY: She smokes about 6 cigarettes per day. She also drinks some alcohol as well.   FAMILY HISTORY: Interesting. Her mother was recently diagnosed with autoimmune lupus disease. She also was diagnosed with achalasia. She recently had a bout of acute liver failure for which she had to go on liver transplantation list. Fortunately, her liver condition resolved on its  own without having to have any surgery done.   REVIEW OF SYSTEMS: There is really no change from the initial history and physical dictation. Please refer to this for information.   PHYSICAL EXAMINATION:  GENERAL: The patient is in no acute distress although she still feels nauseous and retching a little bit.  VITAL SIGNS: She is  afebrile. Vital signs stable.  HEENT:  Normocephalic, atraumatic head. Pupils are equally reactive. Throat was clear.  NECK: Supple.  CARDIAC: Regular rhythm and rate without any murmurs.  LUNGS: Clear bilaterally.  ABDOMEN: Normoactive bowel sounds, soft. There is diffuse tenderness mostly in the epigastric region and periumbilical area. The periumbilical area is where she points to as the site of pain. There is no rebound or guarding. She has active bowel sounds.  EXTREMITIES: No clubbing, cyanosis, or edema.  NEUROLOGIC: Examination is nonfocal.  SKIN: Negative.   LABORATORY DATA:  Electrolytes were completely normal. Potassium level is slightly low today at 3.2, which is being repleted. Beta hCG was normal. Liver enzymes are normal. Her TSH level was normal. White count remains normal at 5.9 today, hemoglobin 12.9, MCV slightly low at 77, differential is completely normal.   Urinalysis is negative.   Again, ultrasound and CT are both negative.   IMPRESSION:  This is a patient with history of Graves' disease who has had intractable nausea, vomiting and some abdominal pain for the past 24-48 hours, but it has been ongoing for the past week or so. She does admit to having some occasional heartburn symptoms. This could be gastroesophageal reflux disease. She could actually have peptic ulcer disease. We plan on doing an upper endoscopy tomorrow morning to evaluate the esophagus and the stomach and biopsies will be performed. The patient's mother had some concern about the patient being poisoned by her boyfriend. If the upper endoscopy is completely normal we may need to consider doing a toxicology  test. With her mother having lupus disease, which can also abdominal pain, nausea, vomiting, we may also need to do a lupus workup for the patient, as well, if no obvious causes are found.   Thank you for this referral. I will continue to follow the patient.    ____________________________ Ezzard Standing. Bluford Kaufmann,  MD pyo:lt D: 06/03/2014 14:37:53 ET T: 06/03/2014 15:08:59 ET JOB#: [id number]  cc: Ezzard Standing. Bluford Kaufmann, MD, <Dictator> Ezzard Standing Lummie Montijo MD ELECTRONICALLY SIGNED 06/07/2014 9:11

## 2014-12-12 NOTE — Consult Note (Signed)
Looks and feels much better today.  Urine tox screen positive for cannabis. EGD showed mild gastritis in antrum only. Biopsies taken. Continue protonix daily for now. Full liquid diet ordered. If tolerated, then advance to low residue diet later today. thanks.  Electronic Signatures: Lutricia Feilh, Jahmar Mckelvy (MD)  (Signed on 15-Oct-15 10:40)  Authored  Last Updated: 15-Oct-15 10:40 by Lutricia Feilh, Mar Walmer (MD)

## 2015-06-05 ENCOUNTER — Encounter: Payer: Self-pay | Admitting: Emergency Medicine

## 2015-06-05 ENCOUNTER — Emergency Department: Payer: Self-pay

## 2015-06-05 ENCOUNTER — Emergency Department
Admission: EM | Admit: 2015-06-05 | Discharge: 2015-06-05 | Disposition: A | Discharge status: Home / Self Care | Payer: Self-pay | Attending: Emergency Medicine | Admitting: Emergency Medicine

## 2015-06-05 DIAGNOSIS — F19230 Other psychoactive substance dependence with withdrawal, uncomplicated: Secondary | ICD-10-CM | POA: Insufficient documentation

## 2015-06-05 DIAGNOSIS — Z3202 Encounter for pregnancy test, result negative: Secondary | ICD-10-CM | POA: Insufficient documentation

## 2015-06-05 DIAGNOSIS — Z792 Long term (current) use of antibiotics: Secondary | ICD-10-CM | POA: Insufficient documentation

## 2015-06-05 DIAGNOSIS — Z79899 Other long term (current) drug therapy: Secondary | ICD-10-CM | POA: Insufficient documentation

## 2015-06-05 DIAGNOSIS — F19930 Other psychoactive substance use, unspecified with withdrawal, uncomplicated: Secondary | ICD-10-CM

## 2015-06-05 DIAGNOSIS — E860 Dehydration: Secondary | ICD-10-CM | POA: Insufficient documentation

## 2015-06-05 DIAGNOSIS — R112 Nausea with vomiting, unspecified: Secondary | ICD-10-CM | POA: Insufficient documentation

## 2015-06-05 DIAGNOSIS — K8020 Calculus of gallbladder without cholecystitis without obstruction: Secondary | ICD-10-CM | POA: Insufficient documentation

## 2015-06-05 LAB — URINALYSIS COMPLETE WITH MICROSCOPIC (ARMC ONLY)
BILIRUBIN URINE: NEGATIVE
Bacteria, UA: NONE SEEN
Glucose, UA: NEGATIVE mg/dL
Leukocytes, UA: NEGATIVE
Nitrite: NEGATIVE
Protein, ur: 100 mg/dL — AB
Specific Gravity, Urine: 1.023 (ref 1.005–1.030)
pH: 7 (ref 5.0–8.0)

## 2015-06-05 LAB — CBC WITH DIFFERENTIAL/PLATELET
BASOS ABS: 0.1 10*3/uL (ref 0–0.1)
Basophils Relative: 1 %
EOS PCT: 1 %
Eosinophils Absolute: 0.1 10*3/uL (ref 0–0.7)
HCT: 44.5 % (ref 35.0–47.0)
Hemoglobin: 14.3 g/dL (ref 12.0–16.0)
LYMPHS PCT: 24 %
Lymphs Abs: 1.8 10*3/uL (ref 1.0–3.6)
MCH: 24.5 pg — AB (ref 26.0–34.0)
MCHC: 32.1 g/dL (ref 32.0–36.0)
MCV: 76.4 fL — ABNORMAL LOW (ref 80.0–100.0)
MONO ABS: 0.6 10*3/uL (ref 0.2–0.9)
Monocytes Relative: 8 %
Neutro Abs: 5 10*3/uL (ref 1.4–6.5)
Neutrophils Relative %: 66 %
PLATELETS: 278 10*3/uL (ref 150–440)
RBC: 5.83 MIL/uL — ABNORMAL HIGH (ref 3.80–5.20)
RDW: 14.5 % (ref 11.5–14.5)
WBC: 7.4 10*3/uL (ref 3.6–11.0)

## 2015-06-05 LAB — COMPREHENSIVE METABOLIC PANEL
ALBUMIN: 4.3 g/dL (ref 3.5–5.0)
ALT: 46 U/L (ref 14–54)
AST: 34 U/L (ref 15–41)
Alkaline Phosphatase: 67 U/L (ref 38–126)
Anion gap: 10 (ref 5–15)
BILIRUBIN TOTAL: 0.7 mg/dL (ref 0.3–1.2)
BUN: 9 mg/dL (ref 6–20)
CHLORIDE: 101 mmol/L (ref 101–111)
CO2: 24 mmol/L (ref 22–32)
CREATININE: 0.87 mg/dL (ref 0.44–1.00)
Calcium: 9.8 mg/dL (ref 8.9–10.3)
GFR calc Af Amer: >60 mL/min (ref >60)
GLUCOSE: 100 mg/dL — AB (ref 65–99)
POTASSIUM: 3.2 mmol/L — AB (ref 3.5–5.1)
Sodium: 135 mmol/L (ref 135–145)
TOTAL PROTEIN: 8.5 g/dL — AB (ref 6.5–8.1)

## 2015-06-05 LAB — PREGNANCY, URINE: Preg Test, Ur: NEGATIVE

## 2015-06-05 LAB — LIPASE, BLOOD: LIPASE: 35 U/L (ref 22–51)

## 2015-06-05 LAB — TSH: TSH: 0.948 u[IU]/mL (ref 0.350–4.500)

## 2015-06-05 MED ORDER — ONDANSETRON HCL 4 MG/2ML IJ SOLN
4.0000 mg | Freq: Once | INTRAMUSCULAR | Status: AC
  Administered 2015-06-05: 4 mg via INTRAVENOUS
  Filled 2015-06-05: qty 2

## 2015-06-05 MED ORDER — SODIUM CHLORIDE 0.9 % IV BOLUS (SEPSIS)
1000.0000 mL | Freq: Once | INTRAVENOUS | Status: AC
  Administered 2015-06-05: 1000 mL via INTRAVENOUS

## 2015-06-05 MED ORDER — NAPROXEN 500 MG PO TABS: 500.0000 mg | ORAL_TABLET | Freq: Two times a day (BID) | ORAL | Status: DC

## 2015-06-05 MED ORDER — OXYCODONE-ACETAMINOPHEN 5-325 MG PO TABS: 1.0000 | ORAL_TABLET | Freq: Four times a day (QID) | ORAL | Status: DC | PRN

## 2015-06-05 MED ORDER — ONDANSETRON 8 MG PO TBDP: 8.0000 mg | ORAL_TABLET | Freq: Three times a day (TID) | ORAL | Status: DC | PRN

## 2015-06-05 MED ORDER — HYDROMORPHONE HCL 1 MG/ML IJ SOLN
0.5000 mg | Freq: Once | INTRAMUSCULAR | Status: AC
  Administered 2015-06-05: 0.5 mg via INTRAVENOUS
  Filled 2015-06-05: qty 1

## 2015-06-05 MED ORDER — DIPHENHYDRAMINE HCL 50 MG/ML IJ SOLN
25.0000 mg | Freq: Once | INTRAMUSCULAR | Status: AC
  Administered 2015-06-05: 25 mg via INTRAVENOUS
  Filled 2015-06-05: qty 1

## 2015-06-05 MED ORDER — DEXTROSE-NACL 5-0.9 % IV SOLN
INTRAVENOUS | Status: DC
  Administered 2015-06-05: 16:00:00 via INTRAVENOUS

## 2015-06-05 NOTE — ED Notes (Signed)
Patient back from [place].

## 2015-06-05 NOTE — Discharge Instructions (Signed)
You were prescribed a medication that is potentially sedating. Do not drink alcohol, drive or participate in any other potentially dangerous activities while taking this medication as it may make you sleepy. Do not take this medication with any other sedating medications, either prescription or over-the-counter. If you were prescribed Percocet or Vicodin, do not take these with acetaminophen (Tylenol) as it is already contained within these medications.   Opioid pain medications (or "narcotics") can be habit forming.  Use it as little as possible to achieve adequate pain control.  Do not use or use it with extreme caution if you have a history of opiate abuse or dependence.  If you are on a pain contract with your primary care doctor or a pain specialist, be sure to let them know you were prescribed this medication today from the [facility] Emergency Department.  This medication is intended for your use only - do not give any to anyone else and keep it in a secure place where nobody else, especially children and pets, have access to it.  It will also cause or worsen constipation, so you may want to consider taking an over-the-counter stool softener while you are taking this medication.  Cholelithiasis Cholelithiasis (also called gallstones) is a form of gallbladder disease in which gallstones form in your gallbladder. The gallbladder is an organ that stores bile made in the liver, which helps digest fats. Gallstones begin as small crystals and slowly grow into stones. Gallstone pain occurs when the gallbladder spasms and a gallstone is blocking the duct. Pain can also occur when a stone passes out of the duct.  RISK FACTORS  Being female.   Having multiple pregnancies. Health care providers sometimes advise removing diseased gallbladders before future pregnancies.   Being obese.  Eating a diet heavy in fried foods and fat.   Being older than 17 years and increasing age.   Prolonged use  of medicines containing female hormones.   Having diabetes mellitus.   Rapidly losing weight.   Having a family history of gallstones (heredity).  SYMPTOMS  Nausea.   Vomiting.  Abdominal pain.   Yellowing of the skin (jaundice).   Sudden pain. It may persist from several minutes to several hours.  Fever.   Tenderness to the touch. In some cases, when gallstones do not move into the bile duct, people have no pain or symptoms. These are called "silent" gallstones.  TREATMENT Silent gallstones do not need treatment. In severe cases, emergency surgery may be required. Options for treatment include:  Surgery to remove the gallbladder. This is the most common treatment.  Medicines. These do not always work and may take 6-12 months or more to work.  Shock wave treatment (extracorporeal biliary lithotripsy). In this treatment an ultrasound machine sends shock waves to the gallbladder to break gallstones into smaller pieces that can pass into the intestines or be dissolved by medicine. HOME CARE INSTRUCTIONS   Only take over-the-counter or prescription medicines for pain, discomfort, or fever as directed by your health care provider.   Follow a low-fat diet until seen again by your health care provider. Fat causes the gallbladder to contract, which can result in pain.   Follow up with your health care provider as directed. Attacks are almost always recurrent and surgery is usually required for permanent treatment.  SEEK IMMEDIATE MEDICAL CARE IF:   Your pain increases and is not controlled by medicines.   You have a fever or persistent symptoms for more than 2-3 days.  You have a fever and your symptoms suddenly get worse.   You have persistent nausea and vomiting.  MAKE SURE YOU:   Understand these instructions.  Will watch your condition.  Will get help right away if you are not doing well or get worse.   This information is not intended to replace  advice given to you by your health care provider. Make sure you discuss any questions you have with your health care provider.   Document Released: 08/03/2005 Document Revised: 04/09/2013 Document Reviewed: 01/29/2013 Elsevier Interactive Patient Education 2016 Elsevier Inc.  Dehydration, Adult Dehydration is a condition in which you do not have enough fluid or water in your body. It happens when you take in less fluid than you lose. Vital organs such as the kidneys, brain, and heart cannot function without a proper amount of fluids. Any loss of fluids from the body can cause dehydration.  Dehydration can range from mild to severe. This condition should be treated right away to help prevent it from becoming severe. CAUSES  This condition may be caused by:  Vomiting.  Diarrhea.  Excessive sweating, such as when exercising in hot or humid weather.  Not drinking enough fluid during strenuous exercise or during an illness.  Excessive urine output.  Fever.  Certain medicines. RISK FACTORS This condition is more likely to develop in:  People who are taking certain medicines that cause the body to lose excess fluid (diuretics).   People who have a chronic illness, such as diabetes, that may increase urination.  Older adults.   People who live at high altitudes.   People who participate in endurance sports.  SYMPTOMS  Mild Dehydration  Thirst.  Dry lips.  Slightly dry mouth.  Dry, warm skin. Moderate Dehydration  Very dry mouth.   Muscle cramps.   Dark urine and decreased urine production.   Decreased tear production.   Headache.   Light-headedness, especially when you stand up from a sitting position.  Severe Dehydration  Changes in skin.   Cold and clammy skin.   Skin does not spring back quickly when lightly pinched and released.   Changes in body fluids.   Extreme thirst.   No tears.   Not able to sweat when body temperature is  high, such as in hot weather.   Minimal urine production.   Changes in vital signs.   Rapid, weak pulse (more than 100 beats per minute when you are sitting still).   Rapid breathing.   Low blood pressure.   Other changes.   Sunken eyes.   Cold hands and feet.   Confusion.  Lethargy and difficulty being awakened.  Fainting (syncope).   Short-term weight loss.   Unconsciousness. DIAGNOSIS  This condition may be diagnosed based on your symptoms. You may also have tests to determine how severe your dehydration is. These tests may include:   Urine tests.   Blood tests.  TREATMENT  Treatment for this condition depends on the severity. Mild or moderate dehydration can often be treated at home. Treatment should be started right away. Do not wait until dehydration becomes severe. Severe dehydration needs to be treated at the hospital. Treatment for Mild Dehydration  Drinking plenty of water to replace the fluid you have lost.   Replacing minerals in your blood (electrolytes) that you may have lost.  Treatment for Moderate Dehydration  Consuming oral rehydration solution (ORS). Treatment for Severe Dehydration  Receiving fluid through an IV tube.   Receiving electrolyte solution through  a feeding tube that is passed through your nose and into your stomach (nasogastric tube or NG tube).  Correcting any abnormalities in electrolytes. HOME CARE INSTRUCTIONS   Drink enough fluid to keep your urine clear or pale yellow.   Drink water or fluid slowly by taking small sips. You can also try sucking on ice cubes.  Have food or beverages that contain electrolytes. Examples include bananas and sports drinks.  Take over-the-counter and prescription medicines only as told by your health care provider.   Prepare ORS according to the manufacturer's instructions. Take sips of ORS every 5 minutes until your urine returns to normal.  If you have vomiting or  diarrhea, continue to try to drink water, ORS, or both.   If you have diarrhea, avoid:   Beverages that contain caffeine.   Fruit juice.   Milk.   Carbonated soft drinks.  Do not take salt tablets. This can lead to the condition of having too much sodium in your body (hypernatremia).  SEEK MEDICAL CARE IF:  You cannot eat or drink without vomiting.  You have had moderate diarrhea during a period of more than 24 hours.  You have a fever. SEEK IMMEDIATE MEDICAL CARE IF:   You have extreme thirst.  You have severe diarrhea.  You have not urinated in 6-8 hours, or you have urinated only a small amount of very dark urine.  You have shriveled skin.  You are dizzy, confused, or both.   This information is not intended to replace advice given to you by your health care provider. Make sure you discuss any questions you have with your health care provider.   Document Released: 08/07/2005 Document Revised: 04/28/2015 Document Reviewed: 12/23/2014 Elsevier Interactive Patient Education [employer]2016 Elsevier Inc.

## 2015-06-05 NOTE — ED Notes (Signed)
Pt to ed with c/o vomiting and nausea x 6 days.  Pt denies diarrhea. Denies bm x 6 days.

## 2015-06-05 NOTE — ED Provider Notes (Signed)
[facility] Emergency Department Provider Note  ____________________________________________  Time seen: 3:00 PM  I have reviewed the triage vital signs and the nursing notes.   HISTORY  Chief Complaint Emesis    HPI Amanda Boone is a 19 y.o. female complaining of nausea vomiting and oral intolerance for the past 6 days. The mother reports and the patient agrees that the patient had been off the College in [place] where over the last 6 months she has been smoking marijuana regularly and also using the synthetic cannabinoids K2. She has not had any symptoms during that time and been feeling well in her usual state of health. However, one week ago she left College and returned home to live with her parents. At that time she abruptly stopped using all marijuana and K2. Denies any alcohol or drug use or any prescription drug use. No chest pain shortness of breath. With this she reports losing 15-20 pounds over the last week and being very dizzy. She's also had subjective fevers and sweats at night.     Past Medical History  Diagnosis Date  . Graves' disease      There are no active problems to display for this patient.    History reviewed. No pertinent past surgical history.   Current Outpatient Rx  Name  Route  Sig  Dispense  Refill  . amoxicillin (AMOXIL) 500 MG capsule   Oral   Take 2 capsules (1,000 mg total) by mouth 2 (two) times daily.   20 capsule   0   . etonogestrel (IMPLANON) 68 MG IMPL implant   Subcutaneous   Inject 1 each into the skin once.         Marland Kitchen. ibuprofen (ADVIL,MOTRIN) 600 MG tablet   Oral   Take 1 tablet (600 mg total) by mouth every 6 (six) hours as needed.   30 tablet   0   . naproxen (NAPROSYN) 500 MG tablet   Oral   Take 1 tablet (500 mg total) by mouth 2 (two) times daily with a meal.   20 tablet   0   . ondansetron (ZOFRAN ODT) 8 MG disintegrating tablet   Oral   Take 1 tablet (8 mg total) by mouth every  8 (eight) hours as needed for nausea or vomiting.   20 tablet   0   . ondansetron (ZOFRAN) 4 MG tablet   Oral   Take 1 tablet (4 mg total) by mouth every 6 (six) hours.   12 tablet   0   . oxyCODONE-acetaminophen (ROXICET) 5-325 MG tablet   Oral   Take 1 tablet by mouth every 6 (six) hours as needed for severe pain.   12 tablet   0   . traMADol (ULTRAM) 50 MG tablet   Oral   Take 1 tablet (50 mg total) by mouth every 6 (six) hours as needed.   15 tablet   0      Allergies Review of patient's allergies indicates no known allergies.   Family History  Problem Relation Age of Onset  . Hypertension Other   . Diabetes Other     Social History Social History  Substance Use Topics  . Smoking status: Never Smoker   . Smokeless tobacco: None  . Alcohol Use: No  positive for marijuana and K2 as above  Review of Systems  Constitutional:   No fever or chills. No weight changes Eyes:   No blurry vision or double vision.  ENT:  No sore throat. Cardiovascular:   No chest pain. Respiratory:   No dyspnea or cough. Gastrointestinal:   Bilateral upper abdominal pain with nausea and vomiting. No diarrhea. No constipation, small bowel movement earlier today.Marland Kitchen  No BRBPR or melena. Genitourinary:   Negative for dysuria, urinary retention, bloody urine, or difficulty urinating.currently menstruating, normal timing for her Musculoskeletal:   Negative for back pain. No joint swelling or pain. Skin:   Negative for rash. Neurological:   Negative for headaches, focal weakness or numbness.positive lightheadedness particularly with standing Psychiatric:  No anxiety or depression.   Endocrine:  No hot/cold intolerance, changes in energy, or sleep difficulty.  10-point ROS otherwise negative.  ____________________________________________   PHYSICAL EXAM:  VITAL SIGNS: ED Triage Vitals  Enc Vitals Group     BP 06/05/15 1434 146/110 mmHg     Pulse Rate 06/05/15 1434 95     Resp  06/05/15 1434 20     Temp 06/05/15 1434 98.6 F (37 C)     Temp Source 06/05/15 1434 Oral     SpO2 06/05/15 1434 97 %     Weight 06/05/15 1434 182 lb (82.555 kg)     Height 06/05/15 1434  (1.626 m)     Head Cir --      Peak Flow --      Pain Score 06/05/15 1435 8     Pain Loc --      Pain Edu? --      Excl. in GC? --      Constitutional:   Alert and oriented. Well appearing and in no distress. Eyes:   No scleral icterus. No conjunctival pallor. PERRL. EOMI ENT   Head:   Normocephalic and atraumatic.   Nose:   No congestion/rhinnorhea. No septal hematoma   Mouth/Throat:   Dry mucous membranes, no pharyngeal erythema. No peritonsillar mass. No uvula shift.   Neck:   No stridor. No SubQ emphysema. No meningismus. Hematological/Lymphatic/Immunilogical:   No cervical lymphadenopathy. Cardiovascular:   RRR heart rate 90-100. Normal and symmetric distal pulses are present in all extremities. No murmurs, rubs, or gallops. Respiratory:   Normal respiratory effort without tachypnea nor retractions. Breath sounds are clear and equal bilaterally. No wheezes/rales/rhonchi. Gastrointestinal:   Soft with severe right upper quadrant tenderness and moderate epigastric and left upper quadrant tenderness. There is of the abdomen is benign.. No distention. There is no CVA tenderness.  No reboundor rigidity. There is some voluntary guarding in the area of the upper abdomen.. Genitourinary:   deferred Musculoskeletal:   Nontender with normal range of motion in all extremities. No joint effusions.  No lower extremity tenderness.  No edema. Neurologic:   Normal speech and language.  CN 2-10 normal. Motor grossly intact. No pronator drift.  Normal gait. No gross focal neurologic deficits are appreciated.  Skin:    Skin is warm, dry and intact. No rash noted.  No petechiae, purpura, or bullae. Psychiatric:   Mood and affect are normal. Speech and behavior are normal. Patient exhibits  appropriate insight and judgment.  ____________________________________________    LABS (pertinent positives/negatives) (all labs ordered are listed, but only abnormal results are displayed) Labs Reviewed  COMPREHENSIVE METABOLIC PANEL - Abnormal; Notable for the following:    Potassium 3.2 (*)    Glucose, Bld 100 (*)    Total Protein 8.5 (*)    All other components within normal limits  CBC WITH DIFFERENTIAL/PLATELET - Abnormal; Notable for the following:    RBC 5.83 (*)  MCV 76.4 (*)    MCH 24.5 (*)    All other components within normal limits  URINALYSIS COMPLETEWITH MICROSCOPIC ([facility] ONLY) - Abnormal; Notable for the following:    Color, Urine AMBER (*)    APPearance HAZY (*)    Ketones, ur TRACE (*)    Hgb urine dipstick 2+ (*)    Protein, ur 100 (*)    Squamous Epithelial / LPF 0-5 (*)    All other components within normal limits  LIPASE, BLOOD  TSH  PREGNANCY, URINE   ____________________________________________   EKG    ____________________________________________    RADIOLOGY  Ultrasound right upper quadrant reveals some sludge and tiny gallstones within the gallbladder without any evidence of cholecystitis.  ____________________________________________   PROCEDURES   ____________________________________________   INITIAL IMPRESSION / ASSESSMENT AND PLAN / ED COURSE  Pertinent labs & imaging results that were available during my care of the patient were reviewed by me and considered in my medical decision making (see chart for details).  Se presents with what is likely a withdrawal syndrome from excessive cannabinoid use, particularly the very potent sympathetic cannabinoid K2.hemodynamically she is stable. We'll focus on symptom control with rehydration with 2 L IV fluids, Zofran and Benadryl and Dilaudid for pain control. Also check a TSH to a history of Graves' disease. Additionally, her upper abdominal pain seems very severe and we'll check an  ultrasound of the right upper quadrant to ensure that she isn't concurrently having an acute biliary process.  ----------------------------------------- 5:21 PM on 06/05/2015 -----------------------------------------  Workup negative. Patient feels much better after IV fluids and IV Zofran. Repeat abdominal exam reveals that the abdomen is now nontender without rebound or guarding. Patient remains hemodynamically stable. Symptoms are controlled. She is tolerating oral intake. I did counsel her on the finding of cholelithiasis on ultrasound and follow-up with primary care.prescribe her medications to continue Controlling her symptoms while she adjusts to the abrupt cessation of marijuana and K2.  ____________________________________________   FINAL CLINICAL IMPRESSION(S) / ED DIAGNOSES  Final diagnoses:  Dehydration  Non-intractable vomiting with nausea, vomiting of unspecified type  Acute drug withdrawal syndrome, uncomplicated (HCC)  Calculus of gallbladder without cholecystitis without obstruction      Sharman Cheek, MD 06/05/15 1723

## 2016-03-28 IMAGING — US ABDOMEN ULTRASOUND LIMITED
1 series · 14 of 25 positions shown · non-contrast
Comparison: None.

CLINICAL DATA: Diffuse abdominal pain and vomiting for 24 hr.
Patient reportedly received morphine prior to examination.

EXAM:
US ABDOMEN LIMITED - RIGHT UPPER QUADRANT

[Series 1: abdomen ultrasound limited · 0.21mm/px · 14 of 41 slices shown]
[im 1/41]
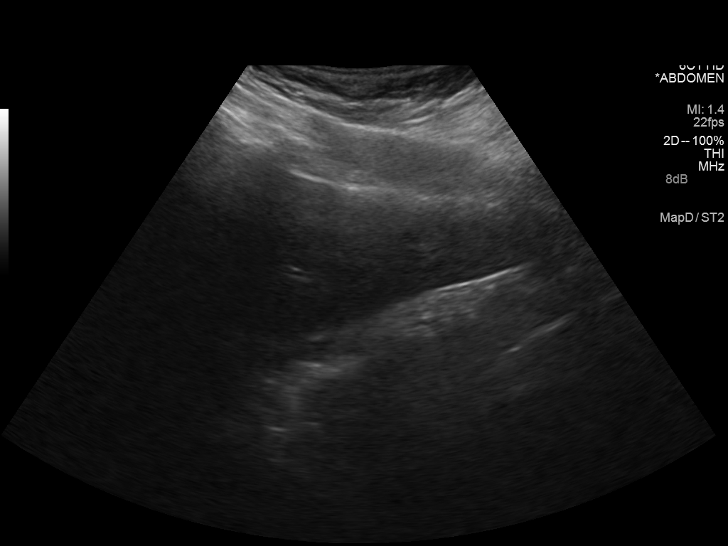
[im 4/41]
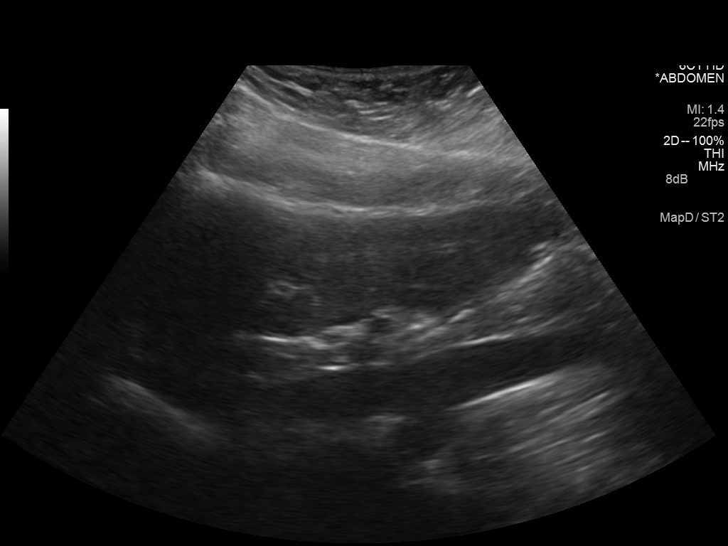
[im 7/41]
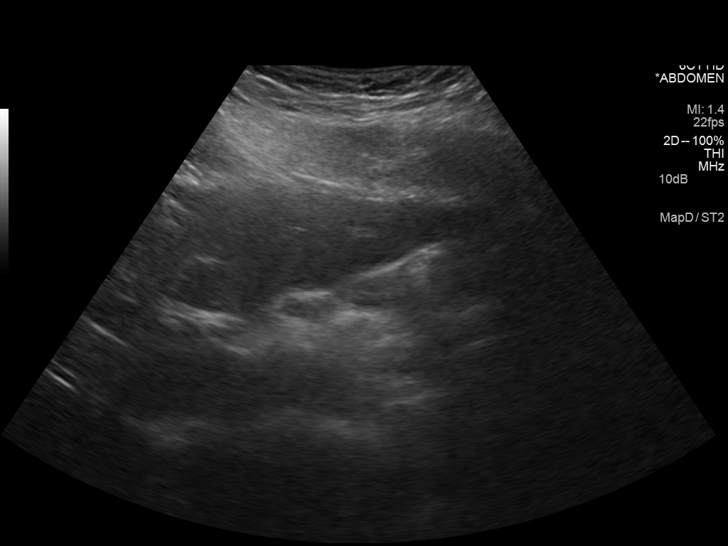
[im 11/41]
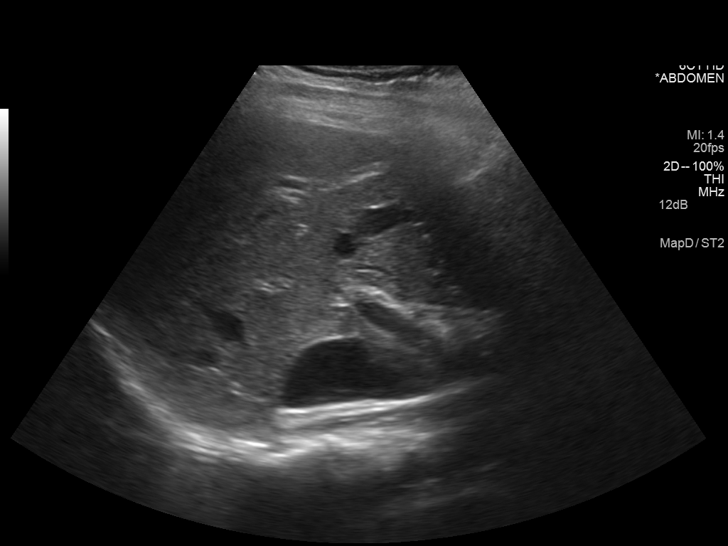
[im 14/41]
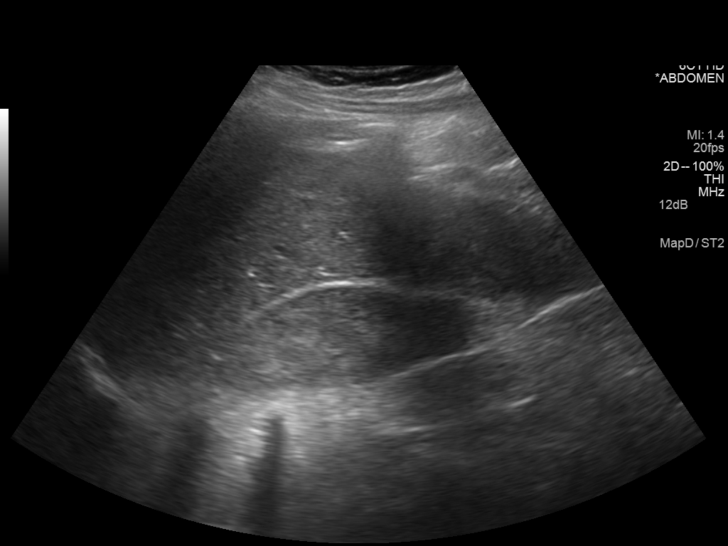
[im 16/41]
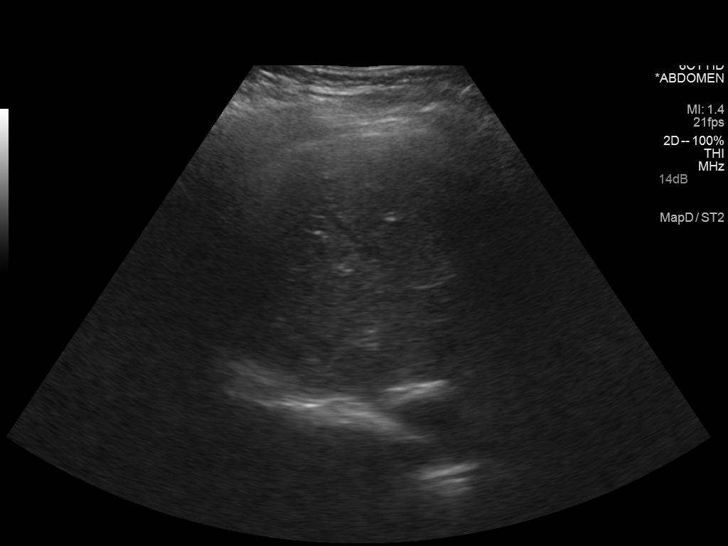
[im 19/41]
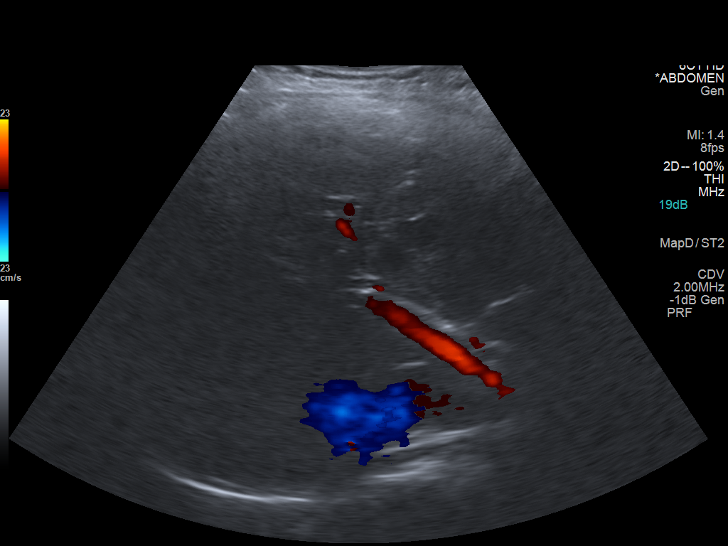
[im 22/41]
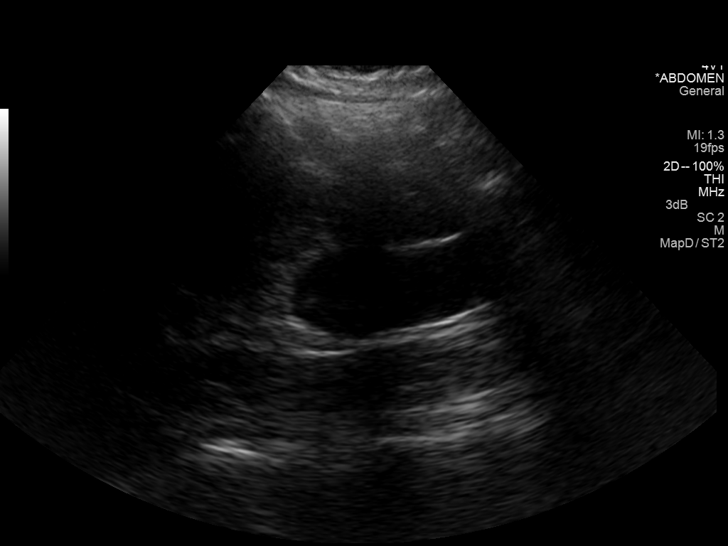
[im 26/41]
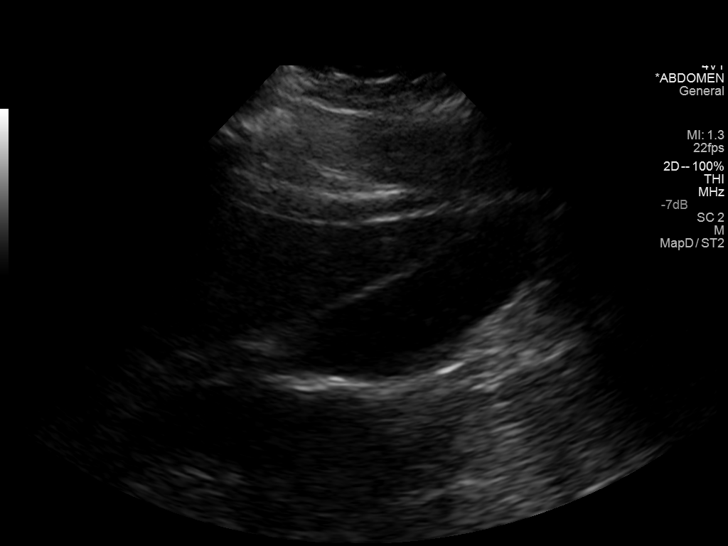
[im 27/41]
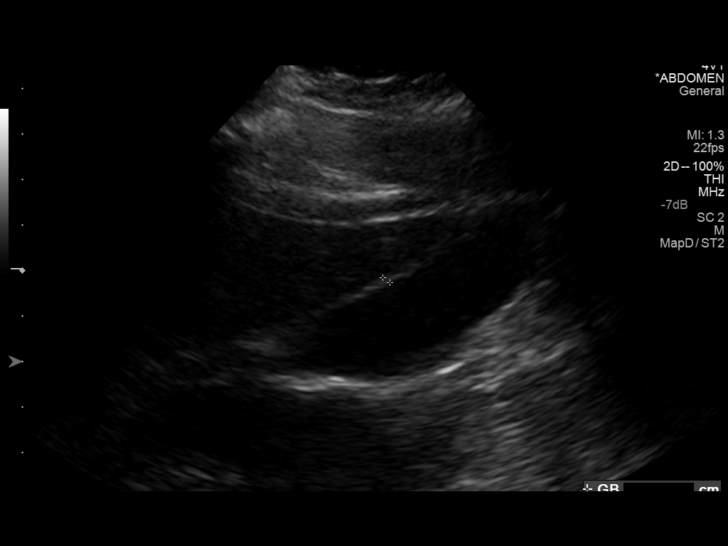
[im 31/41]
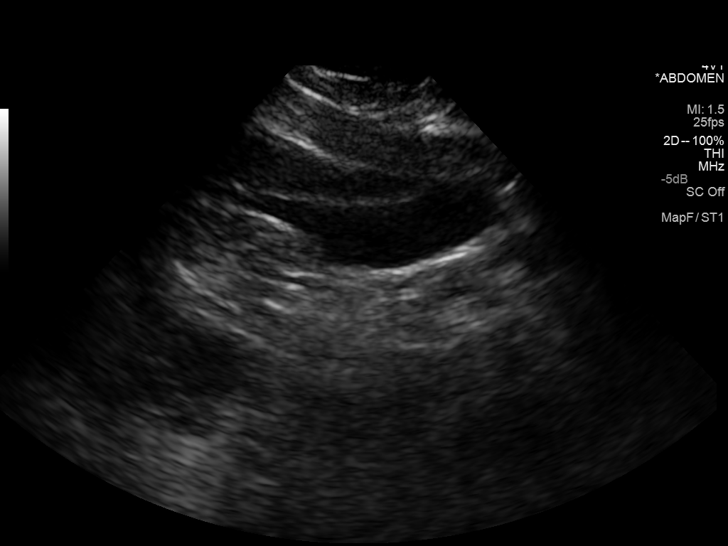
[im 34/41]
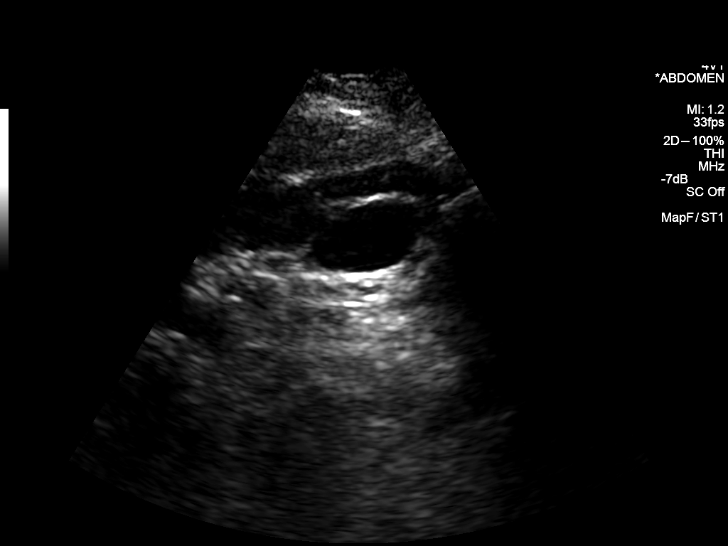
[im 37/41]
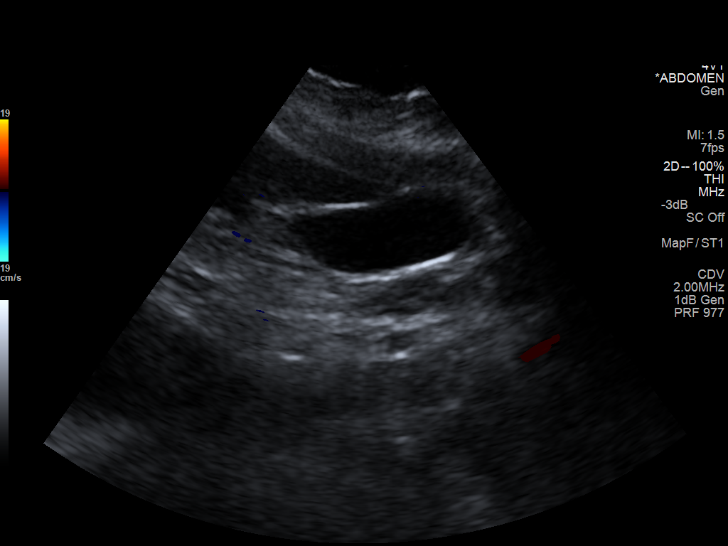
[im 41/41]
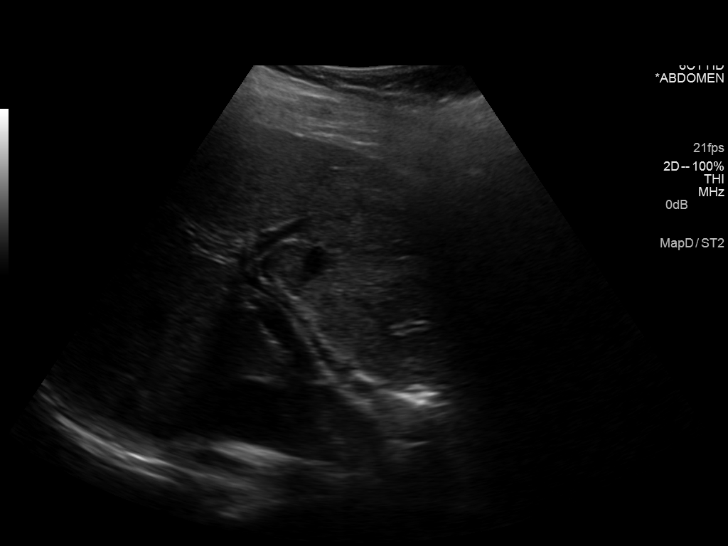

[14 of 25 positions shown; findings below may reference images not displayed]

FINDINGS: Gallbladder:

No gallstones or wall thickening visualized. No sonographic Murphy
sign noted.

Common bile duct:

Diameter: 4 mm

Liver:

No focal lesion identified. Within normal limits in parenchymal
echogenicity. Hepatopetal portal vein.
IMPRESSION: Normal RIGHT upper quadrant abdominal sonogram.

  By: Hamsah Arjuna

## 2016-03-28 IMAGING — CT CT ABD-PELV W/ CM
2 of 4 series · 17 of 46 positions shown, 19 images · IV contrast (agent unspecified)
Comparison: None.

CLINICAL DATA: Right lower quadrant pain with nausea and vomiting.

EXAM:
CT ABDOMEN AND PELVIS WITH CONTRAST
TECHNIQUE: Multidetector CT imaging of the abdomen and pelvis was performed
using the standard protocol following bolus administration of
intravenous contrast.
CONTRAST:  100 cc [id number]

[Series 2: routine abd pel with · axial · 0.80mm/px · z∈[-480,-40]mm · 14 of 96 slices shown, 16 images]
[im 4/96  soft-tissue]
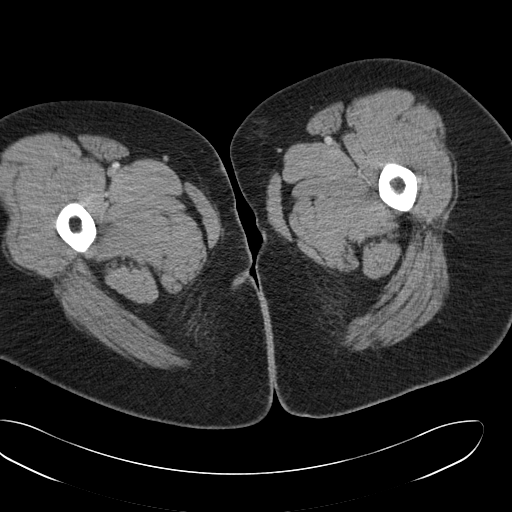
[im 4/96  bone]
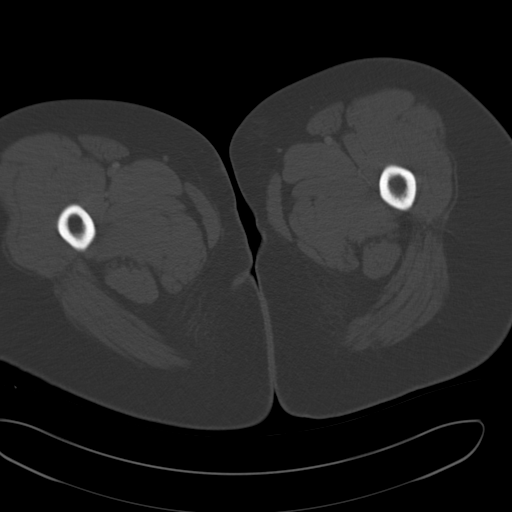
[im 11/96  soft-tissue]
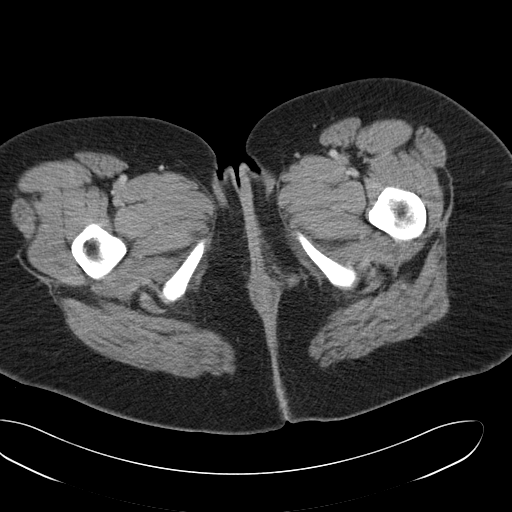
[im 19/96  soft-tissue]
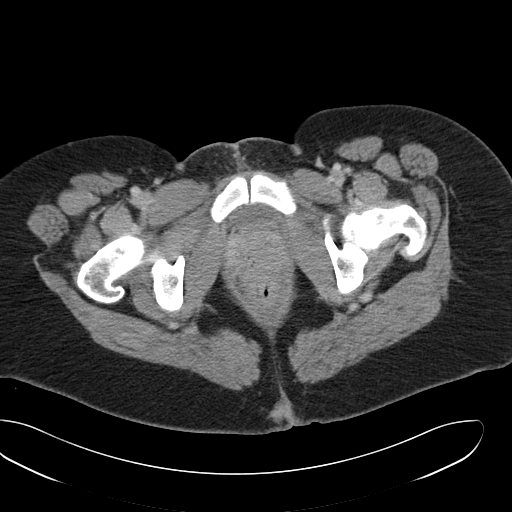
[im 26/96  soft-tissue]
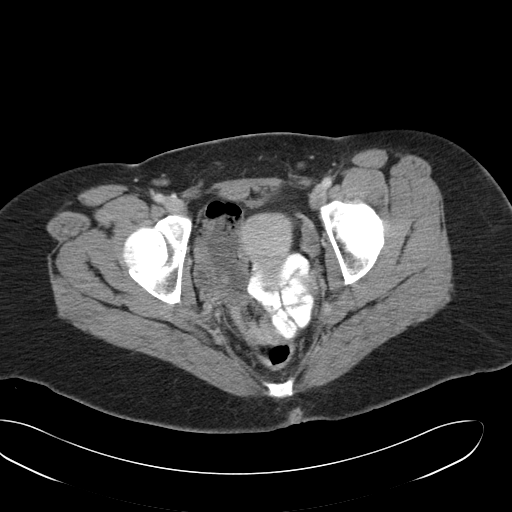
[im 33/96  soft-tissue]
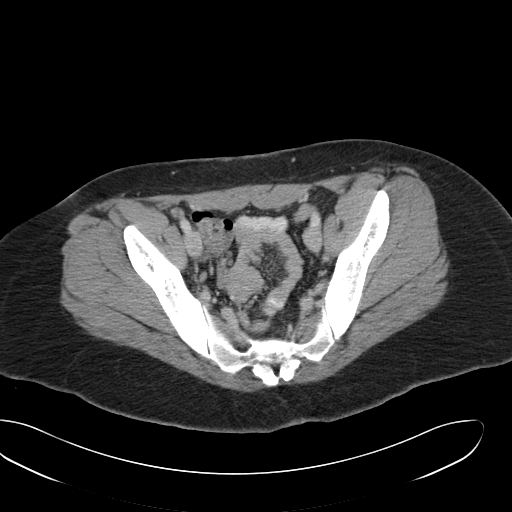
[im 37/96  soft-tissue]
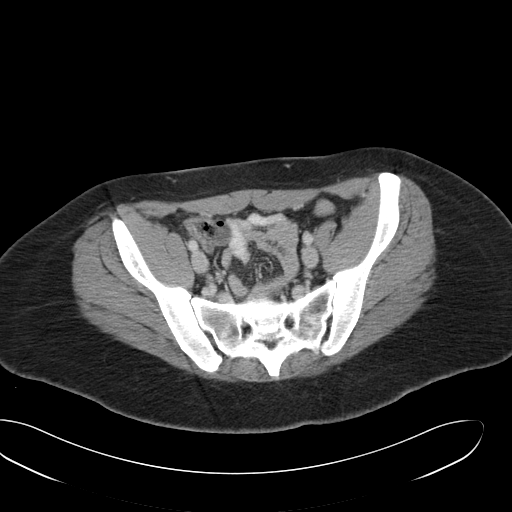
[im 44/96  soft-tissue]
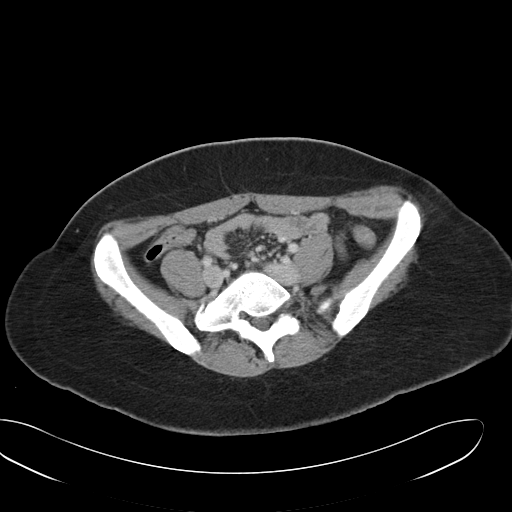
[im 52/96  soft-tissue]
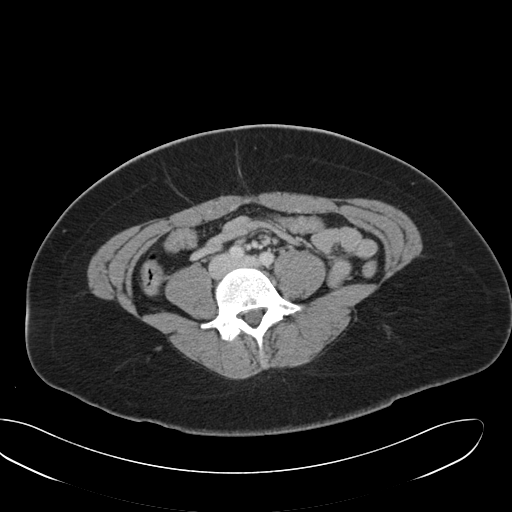
[im 59/96  soft-tissue]
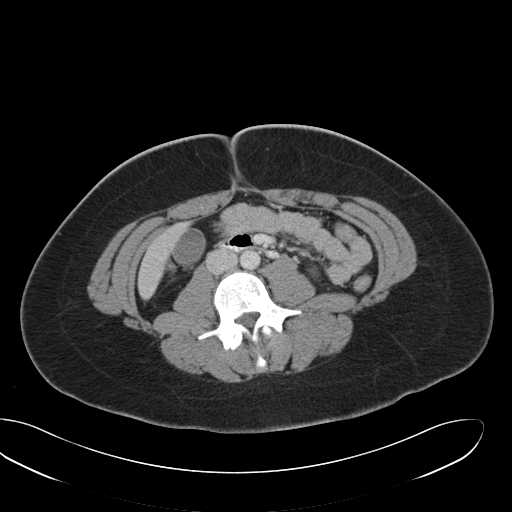
[im 59/96  bone]
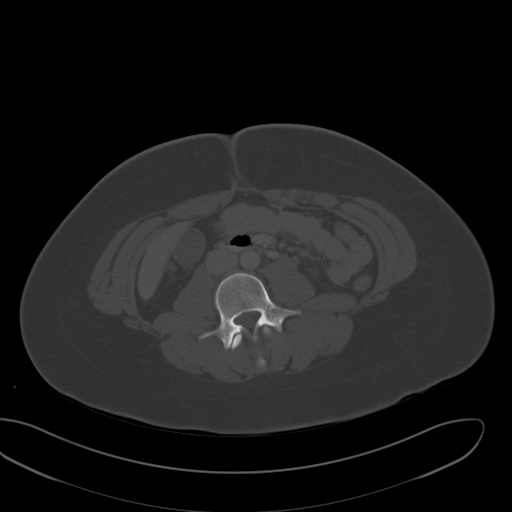
[im 63/96  soft-tissue]
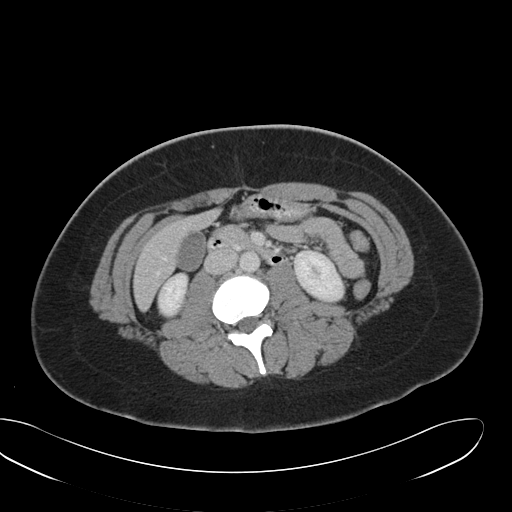
[im 70/96  soft-tissue]
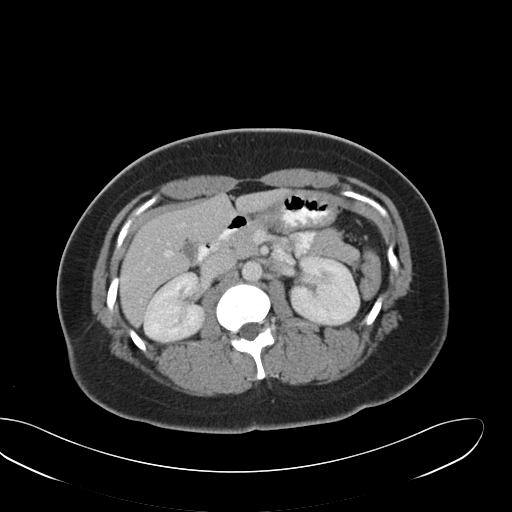
[im 77/96  soft-tissue]
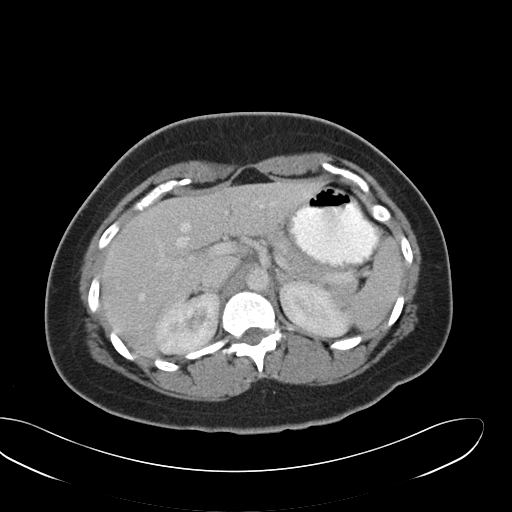
[im 85/96  soft-tissue]
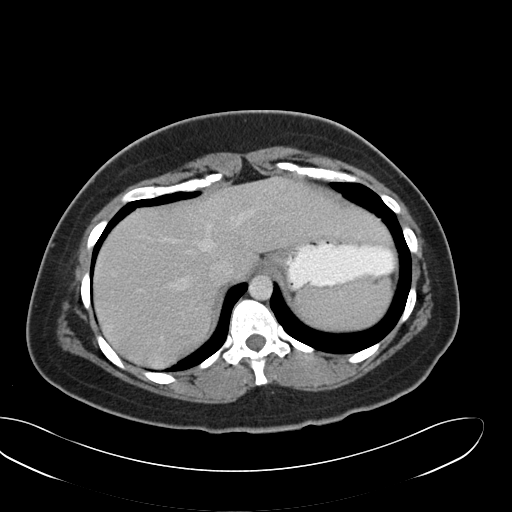
[im 92/96  soft-tissue]
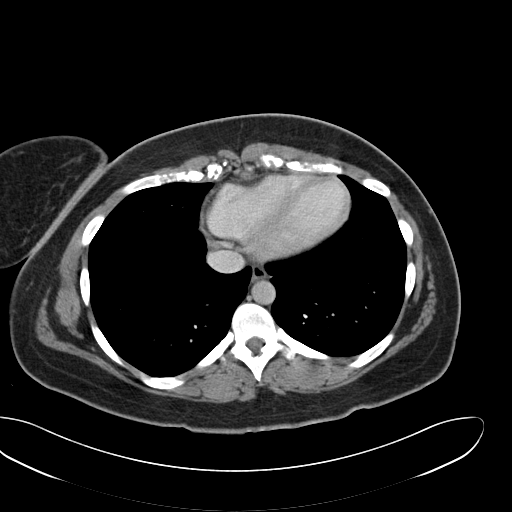

[Series 5: cor routine abd pel with · coronal · 0.79mm/px · 3 of 96 slices shown]
[im 32/96  soft-tissue]
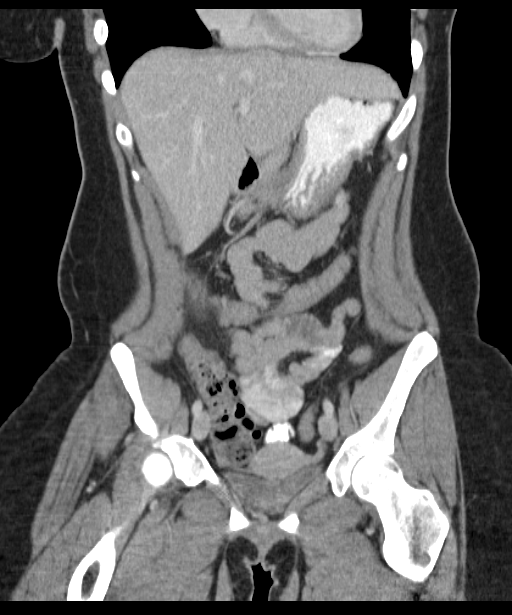
[im 43/96  soft-tissue]
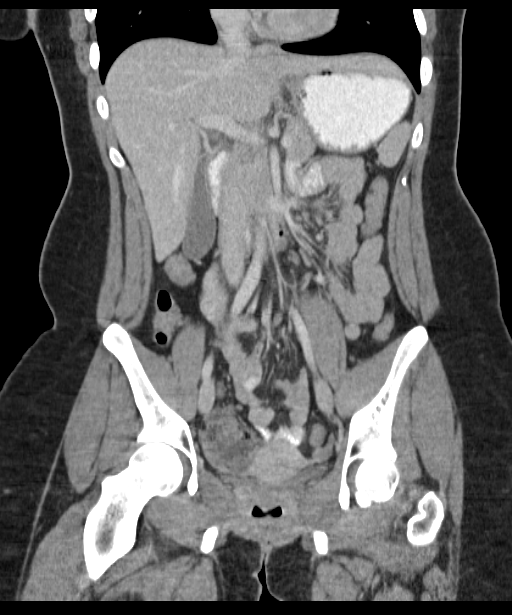
[im 53/96  soft-tissue]
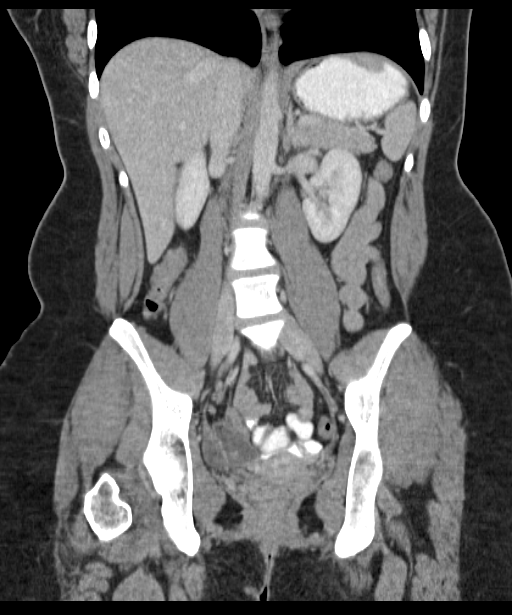

[17 of 46 positions shown; findings below may reference images not displayed]

FINDINGS: The liver, biliary tree, spleen, pancreas, adrenal glands, and
kidneys are normal. Uterus and ovaries are normal. The cecum lies
low in the right side of the pelvis between the right ovary in the
uterus. The terminal ileum appears normal.

The appendix is not visualized. However, there is no free air or
free fluid in the abdomen. No soft tissue stranding in the
mesenteric. No significant osseous abnormality.
IMPRESSION: No acute abnormalities.  However, the appendix is not visualized.

## 2016-08-12 ENCOUNTER — Encounter (HOSPITAL_COMMUNITY): Payer: Self-pay | Admitting: Emergency Medicine

## 2016-08-12 ENCOUNTER — Emergency Department (HOSPITAL_COMMUNITY)
Admission: EM | Admit: 2016-08-12 | Discharge: 2016-08-12 | Disposition: A | Discharge status: Home / Self Care | Payer: Self-pay | Attending: Emergency Medicine | Admitting: Emergency Medicine

## 2016-08-12 DIAGNOSIS — S46811A Strain of other muscles, fascia and tendons at shoulder and upper arm level, right arm, initial encounter: Secondary | ICD-10-CM

## 2016-08-12 DIAGNOSIS — F17210 Nicotine dependence, cigarettes, uncomplicated: Secondary | ICD-10-CM | POA: Insufficient documentation

## 2016-08-12 DIAGNOSIS — S161XXA Strain of muscle, fascia and tendon at neck level, initial encounter: Secondary | ICD-10-CM | POA: Insufficient documentation

## 2016-08-12 DIAGNOSIS — Z79899 Other long term (current) drug therapy: Secondary | ICD-10-CM | POA: Insufficient documentation

## 2016-08-12 DIAGNOSIS — Y939 Activity, unspecified: Secondary | ICD-10-CM | POA: Insufficient documentation

## 2016-08-12 DIAGNOSIS — X58XXXA Exposure to other specified factors, initial encounter: Secondary | ICD-10-CM | POA: Insufficient documentation

## 2016-08-12 DIAGNOSIS — Y929 Unspecified place or not applicable: Secondary | ICD-10-CM | POA: Insufficient documentation

## 2016-08-12 DIAGNOSIS — Y999 Unspecified external cause status: Secondary | ICD-10-CM | POA: Insufficient documentation

## 2016-08-12 MED ORDER — PREDNISONE 50 MG PO TABS
50.0000 mg | ORAL_TABLET | Freq: Every day | ORAL | 0 refills | Status: DC
Start: 2016-08-12 — End: 2018-01-20

## 2016-08-12 MED ORDER — CYCLOBENZAPRINE HCL 10 MG PO TABS: 10.0000 mg | ORAL_TABLET | Freq: Every day | ORAL | 0 refills | Status: DC

## 2016-08-12 MED ORDER — HYDROCODONE-ACETAMINOPHEN 5-325 MG PO TABS: 1.0000 | ORAL_TABLET | Freq: Four times a day (QID) | ORAL | 0 refills | Status: DC | PRN

## 2016-08-12 NOTE — Discharge Instructions (Signed)
Return here as needed.  Use ice and heat on your neck and upper back.

## 2016-08-12 NOTE — ED Provider Notes (Signed)
[facility]-EMERGENCY DEPT Provider Note   CSN: [id number] Arrival date & time: 08/12/16  1151  By signing my name below, I, Octavia Heirrianna Nassar, attest that this documentation has been prepared under the direction and in the presence of [employer] Lilly and CompanyChristopher Janaa Acero, PA-C.  Electronically Signed: Octavia HeirArianna Nassar, ED Scribe. 08/12/16. 2:28 PM.    History   Chief Complaint Chief Complaint  Patient presents with  . Neck Pain    The history is provided by the patient. No language interpreter was used.   HPI Comments: Amanda Boone is a 20 y.o. female who presents to the Emergency Department complaining of sudden onset, moderate neck pain that started last night. She describes the pain as a sharp sensation. Pt notes the pain "shoots" into her upper back. She says she was watching television last night when she felt a sudden "tension" in her neck. Pt expresses having difficulty sleeping secondary to pain. She has taken ibuprofen to relieve her pain without relief. There has been no known injury. She denies fever and bladder/bowel incontinence.  Past Medical History:  Diagnosis Date  . Graves' disease     There are no active problems to display for this patient.   History reviewed. No pertinent surgical history.  OB History    Gravida Para Term Preterm AB Living   0 0 0 0 0 0   SAB TAB Ectopic Multiple Live Births   0 0 0 0         Home Medications    Prior to Admission medications   Medication Sig Start Date End Date Taking? Authorizing Provider  amoxicillin (AMOXIL) 500 MG capsule Take 2 capsules (1,000 mg total) by mouth 2 (two) times daily. 09/12/13   Brandt LoosenJulie Manly, MD  etonogestrel (IMPLANON) 68 MG IMPL implant Inject 1 each into the skin once.    Historical Provider, MD  ibuprofen (ADVIL,MOTRIN) 600 MG tablet Take 1 tablet (600 mg total) by mouth every 6 (six) hours as needed. 09/12/13   Brandt LoosenJulie Manly, MD  naproxen (NAPROSYN) 500 MG tablet Take 1 tablet (500 mg total) by mouth 2 (two) times daily  with a meal. 06/05/15   Sharman CheekPhillip Stafford, MD  ondansetron (ZOFRAN ODT) 8 MG disintegrating tablet Take 1 tablet (8 mg total) by mouth every 8 (eight) hours as needed for nausea or vomiting. 06/05/15   Sharman CheekPhillip Stafford, MD  ondansetron (ZOFRAN) 4 MG tablet Take 1 tablet (4 mg total) by mouth every 6 (six) hours. 09/23/13   Jerelyn ScottMartha Linker, MD  oxyCODONE-acetaminophen (ROXICET) 5-325 MG tablet Take 1 tablet by mouth every 6 (six) hours as needed for severe pain. 06/05/15   Sharman CheekPhillip Stafford, MD  traMADol (ULTRAM) 50 MG tablet Take 1 tablet (50 mg total) by mouth every 6 (six) hours as needed. 09/12/13   Brandt LoosenJulie Manly, MD    Family History Family History  Problem Relation Age of Onset  . Hypertension Other   . Diabetes Other     Social History Social History  Substance Use Topics  . Smoking status: Current Every Day Smoker    Types: Cigarettes  . Smokeless tobacco: Not on file  . Alcohol use No     Allergies   Patient has no known allergies.   Review of Systems Review of Systems  A complete 10 system review of systems was obtained and all systems are negative except as noted in the HPI and PMH.   Physical Exam Updated Vital Signs BP 111/56   Pulse 69   Temp 98.2 F (36.8  C) (Oral)   Resp 16   LMP 07/19/2016   SpO2 100%   Physical Exam  Constitutional: She is oriented to person, place, and time. She appears well-developed and well-nourished. No distress.  HENT:  Head: Normocephalic and atraumatic.  Eyes: EOM are normal.  Neck: Normal range of motion.  Cardiovascular: Normal rate, regular rhythm and normal heart sounds.   Pulmonary/Chest: Effort normal and breath sounds normal.  Abdominal: Soft. She exhibits no distension. There is no tenderness.  Musculoskeletal: Normal range of motion. She exhibits tenderness.  Tenderness to the paraspinal cervical musculature  Neurological: She is alert and oriented to person, place, and time. She has normal strength. She displays normal  reflexes. No sensory deficit. She exhibits normal muscle tone. Coordination and gait normal. GCS eye subscore is 4. GCS verbal subscore is 5. GCS motor subscore is 6.  Skin: Skin is warm and dry.  Psychiatric: She has a normal mood and affect. Judgment normal.  Nursing note and vitals reviewed.    ED Treatments / Results  DIAGNOSTIC STUDIES: Oxygen Saturation is 100% on RA, normal by my interpretation.  COORDINATION OF CARE:  2:26 PM Discussed treatment plan with pt at bedside and pt agreed to plan.  Labs (all labs ordered are listed, but only abnormal results are displayed) Labs Reviewed - No data to display  EKG  EKG Interpretation None       Radiology No results found.  Procedures Procedures (including critical care time)  Medications Ordered in ED Medications - No data to display   Initial Impression / Assessment and Plan / ED Course  I have reviewed the triage vital signs and the nursing notes.  Pertinent labs & imaging results that were available during my care of the patient were reviewed by me and considered in my medical decision making (see chart for details).  Clinical Course     Patient be treated for a trapezius muscle strain.  She has pain along the entirety of the right trapezius muscle.  Patient is advised to use ice and heat on her neck and upper back.  Told to return here as needed.  Patient agrees the plan and all questions were answered  Final Clinical Impressions(s) / ED Diagnoses   Final diagnoses:  None   I personally performed the services described in this documentation, which was scribed in my presence. The recorded information has been reviewed and is accurate.  New Prescriptions New Prescriptions   No medications on file     Charlestine NightChristopher Kristena Wilhelmi, PA-C 08/12/16 1431    Gerhard Munchobert Lockwood, MD 08/12/16 [phone]

## 2016-08-12 NOTE — ED Triage Notes (Signed)
Pt states "i have a pain that started shooting through my neck and my back. My neck is very stiff". Pt denies injury, c/o muscle soreness. Pt in NAD. Denies loss of bladder or bowel.

## 2017-07-07 IMAGING — US US ABDOMEN LIMITED
1 series · 14 of 25 positions shown · non-contrast
Comparison: None.

CLINICAL DATA: Right upper quadrant pain, vomiting for 6 days

EXAM:
US ABDOMEN LIMITED - RIGHT UPPER QUADRANT

[Series 1: us abdomen limited · 0.17mm/px · 14 of 46 slices shown]
[im 1/46]
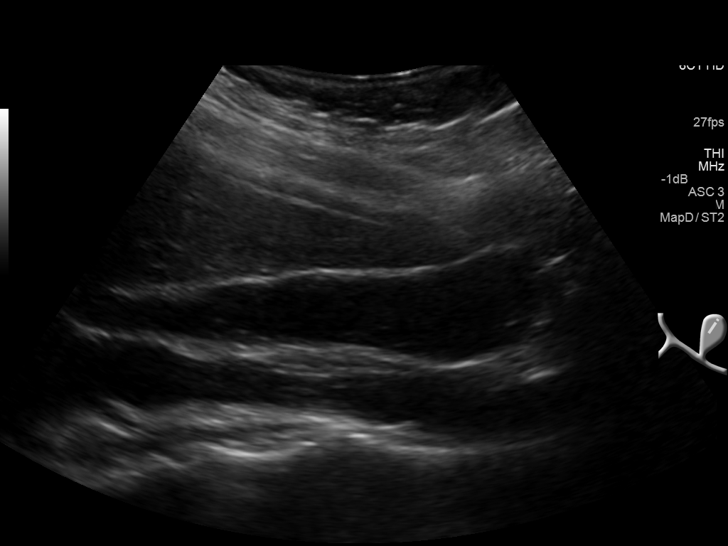
[im 4/46]
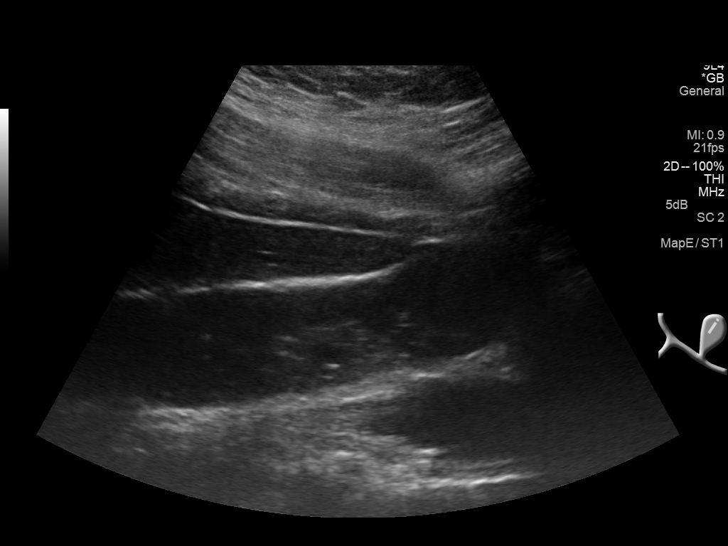
[im 8/46]
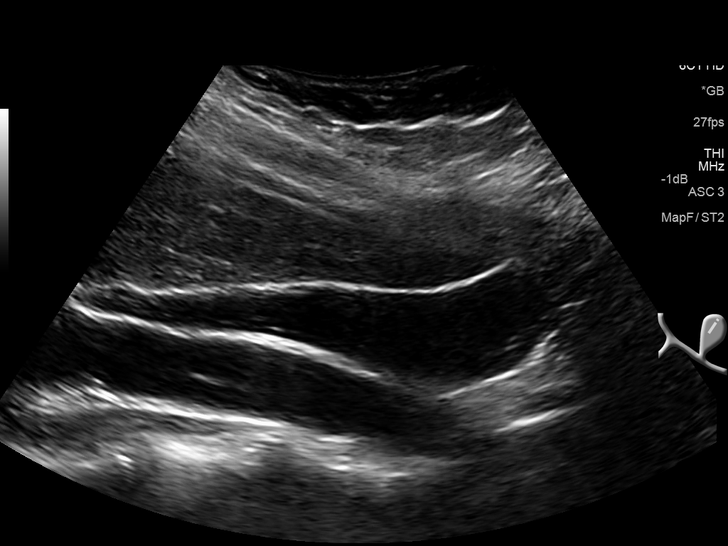
[im 12/46]
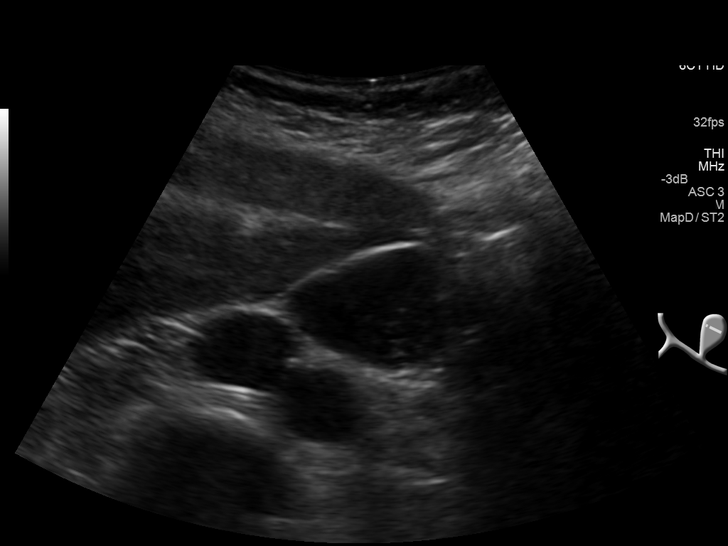
[im 16/46]
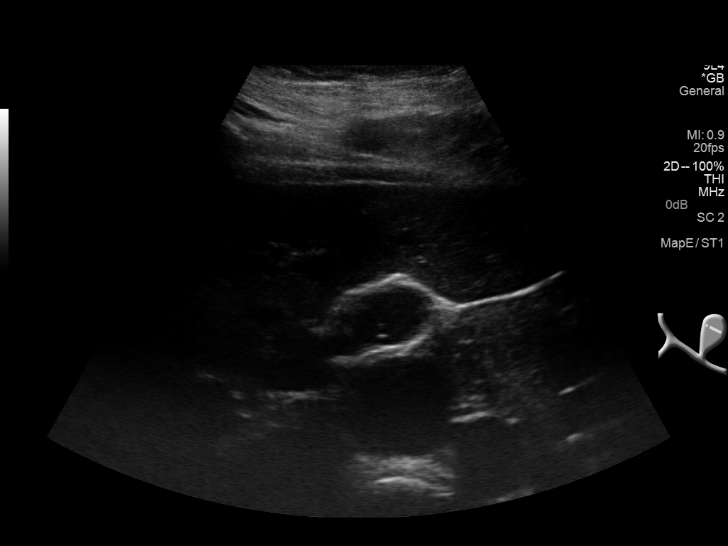
[im 17/46]
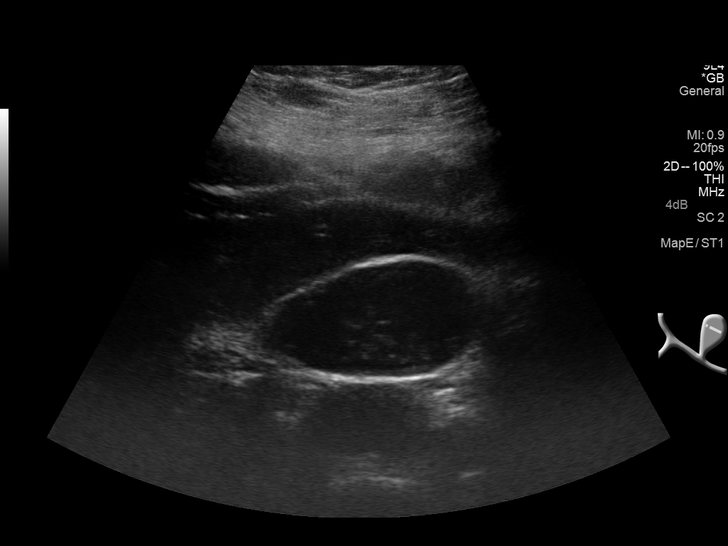
[im 21/46]
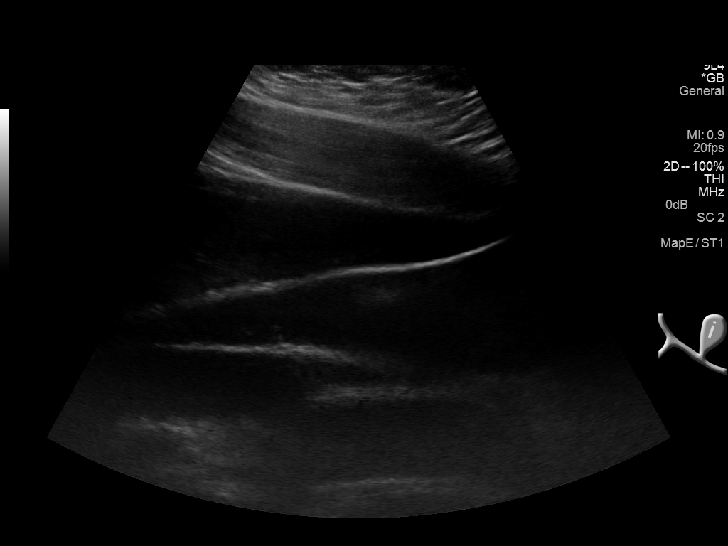
[im 25/46]
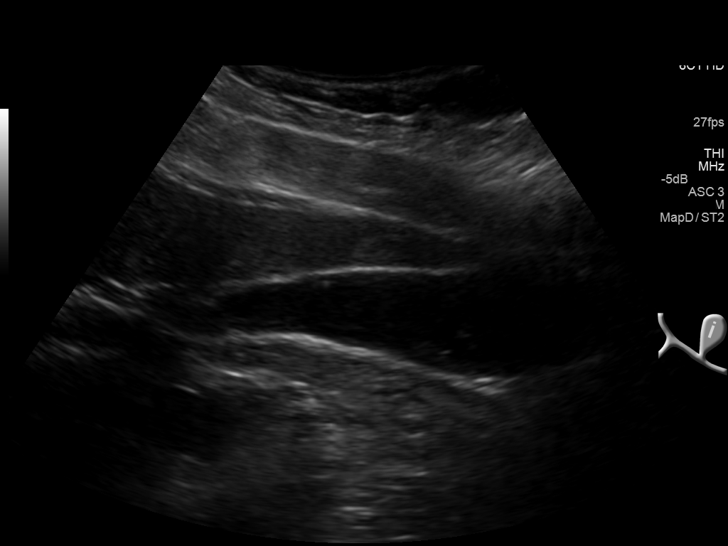
[im 29/46]
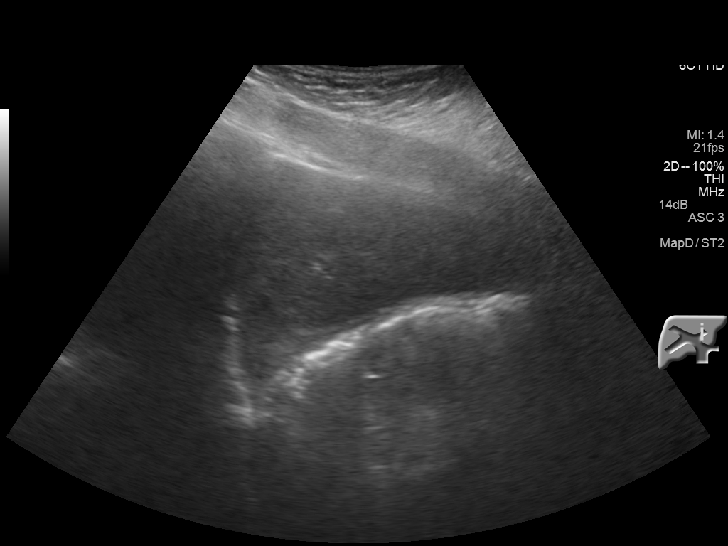
[im 31/46]
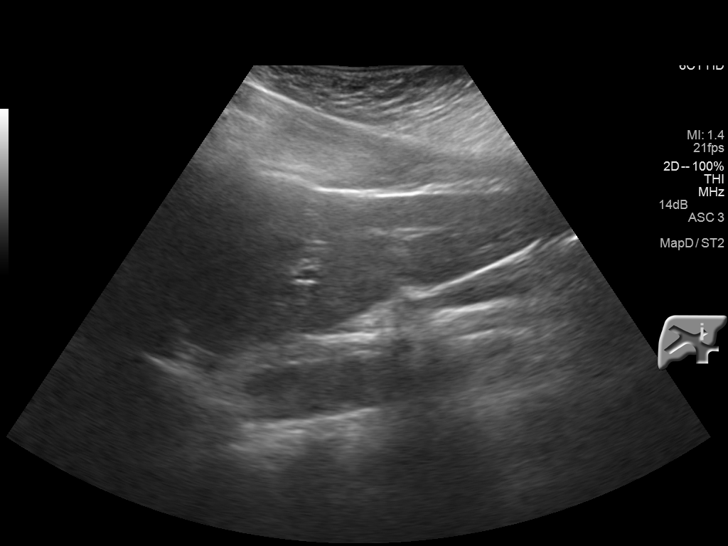
[im 34/46]
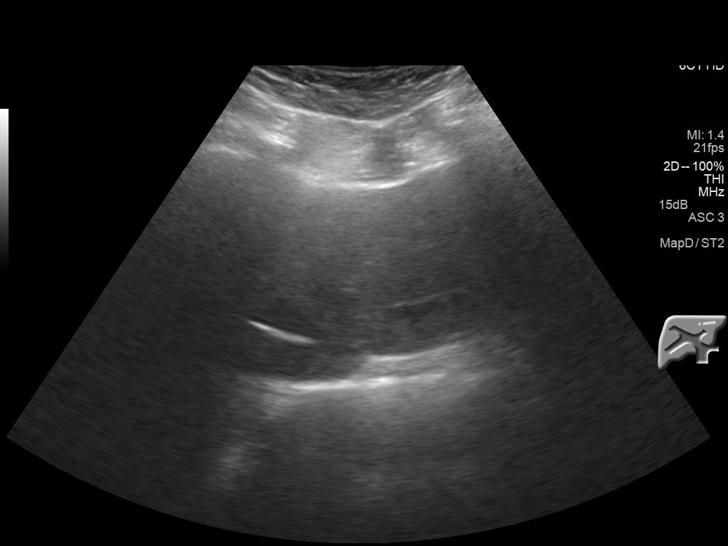
[im 38/46]
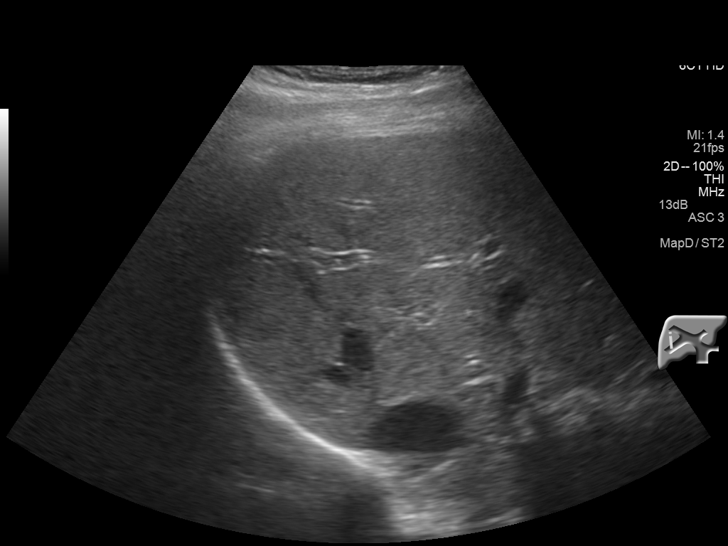
[im 42/46]
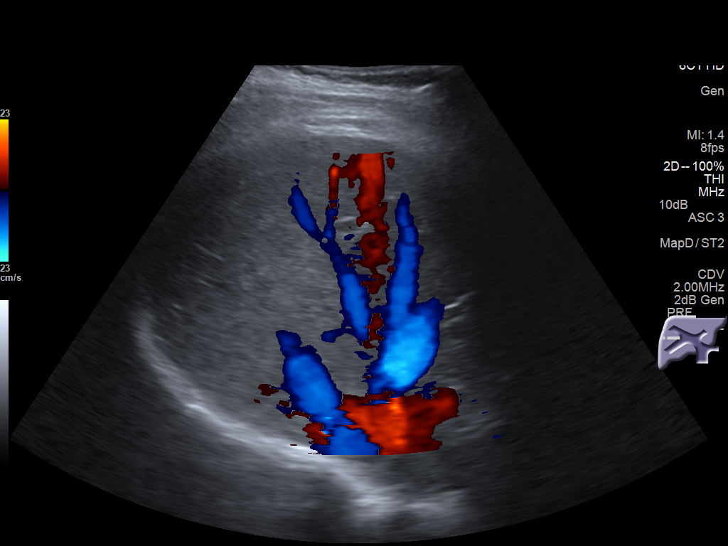
[im 46/46]
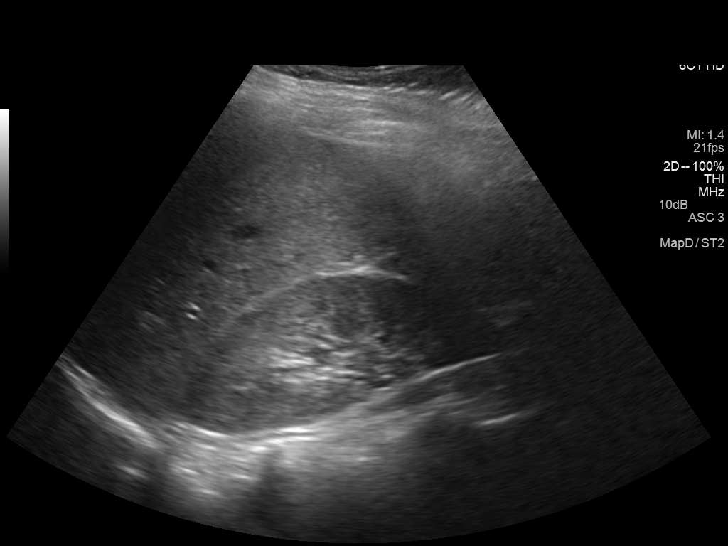

[14 of 25 positions shown; findings below may reference images not displayed]

FINDINGS: Gallbladder:

Mobile sludge and tiny gallstones are noted within gallbladder. No
thickening of gallbladder wall. No sonographic Murphy's sign.

Common bile duct:

Diameter: 2.6 mm in diameter within normal limits.

Liver:

No focal lesion identified. Within normal limits in parenchymal
echogenicity.
IMPRESSION: Mobile sludge and tiny gallstones are noted within gallbladder. No
thickening of gallbladder wall. No sonographic Murphy's sign. Normal
CBD.

## 2018-01-20 ENCOUNTER — Other Ambulatory Visit: Payer: Self-pay

## 2018-01-20 ENCOUNTER — Emergency Department (HOSPITAL_COMMUNITY)
Admission: EM | Admit: 2018-01-20 | Discharge: 2018-01-20 | Disposition: A | Discharge status: Home / Self Care | Payer: Self-pay | Attending: Emergency Medicine | Admitting: Emergency Medicine

## 2018-01-20 ENCOUNTER — Encounter (HOSPITAL_COMMUNITY): Payer: Self-pay | Admitting: Emergency Medicine

## 2018-01-20 DIAGNOSIS — F17210 Nicotine dependence, cigarettes, uncomplicated: Secondary | ICD-10-CM | POA: Insufficient documentation

## 2018-01-20 DIAGNOSIS — A5901 Trichomonal vulvovaginitis: Secondary | ICD-10-CM | POA: Insufficient documentation

## 2018-01-20 DIAGNOSIS — N760 Acute vaginitis: Secondary | ICD-10-CM | POA: Insufficient documentation

## 2018-01-20 DIAGNOSIS — Z79899 Other long term (current) drug therapy: Secondary | ICD-10-CM | POA: Insufficient documentation

## 2018-01-20 DIAGNOSIS — R21 Rash and other nonspecific skin eruption: Secondary | ICD-10-CM | POA: Insufficient documentation

## 2018-01-20 DIAGNOSIS — B9689 Other specified bacterial agents as the cause of diseases classified elsewhere: Secondary | ICD-10-CM | POA: Insufficient documentation

## 2018-01-20 LAB — URINALYSIS, ROUTINE W REFLEX MICROSCOPIC
Bacteria, UA: NONE SEEN
Bilirubin Urine: NEGATIVE
Glucose, UA: NEGATIVE mg/dL
Ketones, ur: 5 mg/dL — AB
Leukocytes, UA: NEGATIVE
Nitrite: NEGATIVE
Protein, ur: NEGATIVE mg/dL
SPECIFIC GRAVITY, URINE: 1.02 (ref 1.005–1.030)
pH: 5 (ref 5.0–8.0)

## 2018-01-20 LAB — WET PREP, GENITAL
Sperm: NONE SEEN
YEAST WET PREP: NONE SEEN

## 2018-01-20 LAB — PREGNANCY, URINE: PREG TEST UR: NEGATIVE

## 2018-01-20 LAB — GROUP A STREP BY PCR: Group A Strep by PCR: NOT DETECTED

## 2018-01-20 MED ORDER — FLUCONAZOLE 150 MG PO TABS: 150.0000 mg | ORAL_TABLET | Freq: Once | ORAL | 0 refills | Status: AC

## 2018-01-20 MED ORDER — METRONIDAZOLE 500 MG PO TABS
2000.0000 mg | ORAL_TABLET | Freq: Once | ORAL | Status: AC
  Administered 2018-01-20: 2000 mg via ORAL
  Filled 2018-01-20: qty 4

## 2018-01-20 NOTE — ED Provider Notes (Signed)
[facility]-EMERGENCY DEPT Provider Note   CSN: [id number] Arrival date & time: 01/20/18  0038     History   Chief Complaint Chief Complaint  Patient presents with  . Urinary Tract Infection  . Rash  . Sore Throat  . Vaginal Itching    HPI Amanda Boone is a 21 y.o. female.  Patient without other medical history presents with symptoms of urinary frequency and vaginal pain with urination. No abdominal pain, nausea, vomiting or fever. No flank pain. She is also concerned about a generalized rash that started over the last 2-3 days. The rash causes itching.   The history is provided by the patient. No language interpreter was used.    Past Medical History:  Diagnosis Date  . Graves' disease     There are no active problems to display for this patient.   History reviewed. No pertinent surgical history.   OB History    Gravida  0   Para  0   Term  0   Preterm  0   AB  0   Living  0     SAB  0   TAB  0   Ectopic  0   Multiple  0   Live Births               Home Medications    Prior to Admission medications   Medication Sig Start Date End Date Taking? Authorizing Provider  amoxicillin (AMOXIL) 500 MG capsule Take 2 capsules (1,000 mg total) by mouth 2 (two) times daily. 09/12/13   Brandt Loosen, MD  cyclobenzaprine (FLEXERIL) 10 MG tablet Take 1 tablet (10 mg total) by mouth at bedtime. 08/12/16   Lawyer, Cristal Deer, PA-C  etonogestrel (IMPLANON) 68 MG IMPL implant Inject 1 each into the skin once.    [provider]  HYDROcodone-acetaminophen (NORCO/VICODIN) 5-325 MG tablet Take 1 tablet by mouth every 6 (six) hours as needed for moderate pain. 08/12/16   Lawyer, Cristal Deer, PA-C  ibuprofen (ADVIL,MOTRIN) 600 MG tablet Take 1 tablet (600 mg total) by mouth every 6 (six) hours as needed. 09/12/13   Brandt Loosen, MD  naproxen (NAPROSYN) 500 MG tablet Take 1 tablet (500 mg total) by mouth 2 (two) times daily with a meal.  06/05/15   Sharman Cheek, MD  ondansetron (ZOFRAN ODT) 8 MG disintegrating tablet Take 1 tablet (8 mg total) by mouth every 8 (eight) hours as needed for nausea or vomiting. 06/05/15   Sharman Cheek, MD  ondansetron (ZOFRAN) 4 MG tablet Take 1 tablet (4 mg total) by mouth every 6 (six) hours. 09/23/13   Mabe, Latanya Maudlin, MD  oxyCODONE-acetaminophen (ROXICET) 5-325 MG tablet Take 1 tablet by mouth every 6 (six) hours as needed for severe pain. 06/05/15   Sharman Cheek, MD  predniSONE (DELTASONE) 50 MG tablet Take 1 tablet (50 mg total) by mouth daily with breakfast. 08/12/16   Lawyer, Cristal Deer, PA-C  traMADol (ULTRAM) 50 MG tablet Take 1 tablet (50 mg total) by mouth every 6 (six) hours as needed. 09/12/13   Brandt Loosen, MD    Family History Family History  Problem Relation Age of Onset  . Hypertension Other   . Diabetes Other     Social History Social History   Tobacco Use  . Smoking status: Current Every Day Smoker    Types: Cigarettes  . Smokeless tobacco: Never Used  Substance Use Topics  . Alcohol use: No  . Drug use: Yes    Types:  Marijuana    Comment: K2     Allergies   Patient has no known allergies.   Review of Systems Review of Systems  Constitutional: Negative for chills and fever.  Respiratory: Negative.   Cardiovascular: Negative.   Gastrointestinal: Negative.  Negative for abdominal pain and nausea.  Genitourinary: Positive for dysuria, frequency and vaginal pain.  Musculoskeletal: Negative.   Skin: Positive for rash.  Neurological: Negative.      Physical Exam Updated Vital Signs BP 124/82 (BP Location: Left Arm)   Pulse 77   Temp 98.6 F (37 C) (Oral)   Resp 18   Ht 5\' 4"  (1.626 m)   Wt 104.3 kg (230 lb)   SpO2 100%   BMI 39.48 kg/m   Physical Exam  Constitutional: She is oriented to person, place, and time. She appears well-developed and well-nourished.  Neck: Normal range of motion.  Pulmonary/Chest: Effort normal.    Abdominal: Soft. There is no tenderness.  Genitourinary: Uterus normal. Cervix exhibits no motion tenderness, no discharge and no friability. Right adnexum displays no mass and no tenderness. Left adnexum displays no mass and no tenderness. There is bleeding (scant cervical bleeding c/w history) in the vagina.  Neurological: She is alert and oriented to person, place, and time.  Skin: Skin is warm and dry.  No visualized rash noted.   Psychiatric: She has a normal mood and affect.     ED Treatments / Results  Labs (all labs ordered are listed, but only abnormal results are displayed) Labs Reviewed  URINALYSIS, ROUTINE W REFLEX MICROSCOPIC - Abnormal; Notable for the following components:      Result Value   Hgb urine dipstick SMALL (*)    Ketones, ur 5 (*)    All other components within normal limits  GROUP A STREP BY PCR  WET PREP, GENITAL  PREGNANCY, URINE  POC URINE PREG, ED  GC/CHLAMYDIA PROBE AMP ([place]    EKG None  Radiology No results found.  Procedures Procedures (including critical care time)  Medications Ordered in ED Medications - No data to display   Initial Impression / Assessment and Plan / ED Course  I have reviewed the triage vital signs and the nursing notes.  Pertinent labs & imaging results that were available during my care of the patient were reviewed by me and considered in my medical decision making (see chart for details).     Patient presents with concern for generalized rash, vaginal pain and urinary frequency. No N, V, abdominal pain or fever.   She is in NAD. Benign abdominal and vaginal exams. Positive for trichomonas and BV. No evidence of CMT, vaginal discharge or pelvic tenderness on exam. Will wait for cultures to determine if STD treatment is required for GC or chlamydia.   She can be discharged home. Will provide Diflucan with history of yeast infections. Provided resources for PCP follow up.   Final Clinical  Impressions(s) / ED Diagnoses   Final diagnoses:  None   1. BV 2. Trichomonas  ED Discharge Orders    None       Elpidio AnisUpstill, Muneeb Veras, PA-C 01/20/18 0533    Zadie RhineWickline, Donald, MD 01/21/18 [phone]

## 2018-01-20 NOTE — ED Triage Notes (Signed)
Patient is complaining of a body rash, sore throat, vaginal yeast, and uti. Patient is itchy all over. Patient is having vaginal itching.

## 2018-01-21 LAB — GC/CHLAMYDIA PROBE AMP (~~LOC~~) NOT AT ARMC
Chlamydia: NEGATIVE
Neisseria Gonorrhea: NEGATIVE
